# Patient Record
Sex: Female | Born: 1975 | Race: Black or African American | Hispanic: No | Marital: Single | Smoking: Never smoker
Health system: Southern US, Community
[De-identification: ages and names within clinical notes are randomized; demographics above are authoritative.]

## PROBLEM LIST (undated history)

## (undated) DIAGNOSIS — R51 Headache: Secondary | ICD-10-CM

## (undated) DIAGNOSIS — D259 Leiomyoma of uterus, unspecified: Secondary | ICD-10-CM

## (undated) DIAGNOSIS — R87629 Unspecified abnormal cytological findings in specimens from vagina: Secondary | ICD-10-CM

## (undated) DIAGNOSIS — F32A Depression, unspecified: Secondary | ICD-10-CM

## (undated) DIAGNOSIS — J40 Bronchitis, not specified as acute or chronic: Secondary | ICD-10-CM

## (undated) DIAGNOSIS — N939 Abnormal uterine and vaginal bleeding, unspecified: Secondary | ICD-10-CM

## (undated) DIAGNOSIS — N809 Endometriosis, unspecified: Secondary | ICD-10-CM

## (undated) DIAGNOSIS — E669 Obesity, unspecified: Secondary | ICD-10-CM

## (undated) DIAGNOSIS — F329 Major depressive disorder, single episode, unspecified: Secondary | ICD-10-CM

## (undated) HISTORY — DX: Abnormal uterine and vaginal bleeding, unspecified: N93.9

## (undated) HISTORY — DX: Endometriosis, unspecified: N80.9

## (undated) HISTORY — PX: FOOT SURGERY: SHX648

## (undated) HISTORY — PX: DILATION AND CURETTAGE OF UTERUS: SHX78

## (undated) SURGERY — DILATATION AND CURETTAGE /HYSTEROSCOPY
Anesthesia: Choice | Laterality: Bilateral

---

## 2010-07-20 ENCOUNTER — Emergency Department (HOSPITAL_COMMUNITY)
Admission: EM | Admit: 2010-07-20 | Discharge: 2010-07-20 | Disposition: A | Discharge status: Home / Self Care | Payer: Medicaid - Out of State | Attending: Emergency Medicine | Admitting: Emergency Medicine

## 2010-07-20 ENCOUNTER — Emergency Department (HOSPITAL_COMMUNITY): Payer: Medicaid - Out of State

## 2010-07-20 DIAGNOSIS — R071 Chest pain on breathing: Secondary | ICD-10-CM | POA: Insufficient documentation

## 2010-07-20 LAB — POCT I-STAT, CHEM 8
BUN: 13 mg/dL (ref 6–23)
Calcium, Ion: 1.14 mmol/L (ref 1.12–1.32)
Chloride: 105 mEq/L (ref 96–112)
Creatinine, Ser: 0.8 mg/dL (ref 0.4–1.2)
Glucose, Bld: 125 mg/dL — ABNORMAL HIGH (ref 70–99)
HCT: 40 % (ref 36.0–46.0)
Hemoglobin: 13.6 g/dL (ref 12.0–15.0)
Potassium: 4.1 mEq/L (ref 3.5–5.1)
Sodium: 140 mEq/L (ref 135–145)
TCO2: 23 mmol/L (ref 0–100)

## 2010-07-20 LAB — POCT CARDIAC MARKERS
CKMB, poc: 5.5 ng/mL (ref 1.0–8.0)
Myoglobin, poc: 355 ng/mL (ref 12–200)
Troponin i, poc: <0.05 ng/mL (ref 0.00–0.09)

## 2010-07-20 LAB — D-DIMER, QUANTITATIVE: D-Dimer, Quant: 0.41 ug/mL-FEU (ref 0.00–0.48)

## 2011-02-12 ENCOUNTER — Emergency Department (INDEPENDENT_AMBULATORY_CARE_PROVIDER_SITE_OTHER)
Admission: EM | Admit: 2011-02-12 | Discharge: 2011-02-12 | Disposition: A | Discharge status: Home / Self Care | Payer: Self-pay | Source: Home / Self Care | Attending: Emergency Medicine | Admitting: Emergency Medicine

## 2011-02-12 DIAGNOSIS — R6889 Other general symptoms and signs: Secondary | ICD-10-CM

## 2011-02-12 HISTORY — DX: Bronchitis, not specified as acute or chronic: J40

## 2011-02-12 MED ORDER — ACETAMINOPHEN-CODEINE #3 300-30 MG PO TABS: 1.0000 | ORAL_TABLET | Freq: Four times a day (QID) | ORAL | Status: AC | PRN

## 2011-02-12 NOTE — ED Notes (Signed)
Pt states symptoms started Friday evening, c/o chills, bodyaches, cough, fever, weakness and headache

## 2011-02-12 NOTE — ED Provider Notes (Signed)
History     CSN: [id number] Arrival date & time: 02/12/2011 10:06 AM   First MD Initiated Contact with Patient 02/12/11 0945      Chief Complaint  Patient presents with  . Cough    non productive cough, bodyaches, chills, ha, fever, weakness    (Consider location/radiation/quality/duration/timing/severity/associated sxs/prior treatment) HPI Comments: Since Friday body aches and  sweating, runny and congested nose and coughing"  Patient is a 35 y.o. female presenting with cough. The history is provided by the patient.  Cough This is a new problem. The current episode started 2 days ago. The problem occurs constantly. The cough is non-productive. Associated symptoms include chills, sweats, headaches, rhinorrhea, sore throat and myalgias. Pertinent negatives include no shortness of breath and no wheezing. She has tried decongestants for the symptoms. She is not a smoker. Her past medical history does not include bronchitis.    Past Medical History  Diagnosis Date  . Asthma   . Bronchitis     Past Surgical History  Procedure Date  . Cesarean section   . Dilation and curettage of uterus     History reviewed. No pertinent family history.  History  Substance Use Topics  . Smoking status: Never Smoker   . Smokeless tobacco: Never Used  . Alcohol Use: 1.2 oz/week    1 Glasses of wine, 1 Cans of beer per week     occasional    OB History    Grav Para Term Preterm Abortions TAB SAB Ect Mult Living                  Review of Systems  Constitutional: Positive for chills.  HENT: Positive for sore throat and rhinorrhea.   Respiratory: Positive for cough. Negative for shortness of breath and wheezing.   Musculoskeletal: Positive for myalgias.  Neurological: Positive for headaches.    Allergies  Review of patient's allergies indicates no known allergies.  Home Medications   Current Outpatient Rx  Name Route Sig Dispense Refill  . AMOXICILLIN 250 MG PO CAPS Oral Take  250 mg by mouth 3 (three) times daily.      . IBUPROFEN 200 MG PO TABS Oral Take 200 mg by mouth every 6 (six) hours as needed.      . ACETAMINOPHEN-CODEINE #3 300-30 MG PO TABS Oral Take 1-2 tablets by mouth every 6 (six) hours as needed for pain. 15 tablet 0    BP 176/83  Pulse 103  Temp(Src) 99.2 F (37.3 C) (Oral)  Resp 19  SpO2 95%  Physical Exam  Nursing note and vitals reviewed. Constitutional: She appears well-developed and well-nourished. No distress.  HENT:  Nose: Rhinorrhea present.  Mouth/Throat: Uvula is midline, oropharynx is clear and moist and mucous membranes are normal. No oropharyngeal exudate.  Eyes: Conjunctivae are normal.  Neck: Normal range of motion. Neck supple. No JVD present. No Kernig's sign noted. No thyromegaly present.  Cardiovascular: Regular rhythm.   Pulmonary/Chest: Effort normal and breath sounds normal. She has no wheezes. She has no rhonchi. She has no rales.  Lymphadenopathy:    She has no cervical adenopathy.    ED Course  Procedures (including critical care time)  Labs Reviewed - No data to display No results found.   1. Influenza-like symptoms       MDM  ILI x 48 hours- Normal lung exam        Jimmie Molly, MD 02/12/11 1108

## 2011-04-01 ENCOUNTER — Emergency Department (HOSPITAL_COMMUNITY): Payer: Self-pay

## 2011-04-01 ENCOUNTER — Encounter (HOSPITAL_COMMUNITY): Payer: Self-pay | Admitting: Emergency Medicine

## 2011-04-01 ENCOUNTER — Emergency Department (HOSPITAL_COMMUNITY)
Admission: EM | Admit: 2011-04-01 | Discharge: 2011-04-01 | Disposition: A | Discharge status: Home / Self Care | Payer: Self-pay | Attending: Emergency Medicine | Admitting: Emergency Medicine

## 2011-04-01 DIAGNOSIS — M79673 Pain in unspecified foot: Secondary | ICD-10-CM

## 2011-04-01 DIAGNOSIS — J45909 Unspecified asthma, uncomplicated: Secondary | ICD-10-CM | POA: Insufficient documentation

## 2011-04-01 DIAGNOSIS — M79609 Pain in unspecified limb: Secondary | ICD-10-CM | POA: Insufficient documentation

## 2011-04-01 DIAGNOSIS — M722 Plantar fascial fibromatosis: Secondary | ICD-10-CM | POA: Insufficient documentation

## 2011-04-01 MED ORDER — IBUPROFEN 600 MG PO TABS: 600.0000 mg | ORAL_TABLET | Freq: Four times a day (QID) | ORAL | Status: AC | PRN

## 2011-04-01 MED ORDER — HYDROCODONE-ACETAMINOPHEN 5-500 MG PO TABS: 1.0000 | ORAL_TABLET | Freq: Four times a day (QID) | ORAL | Status: AC | PRN

## 2011-04-01 NOTE — ED Notes (Signed)
Pt. Stated, My feet started hurting 10 days ago and I had an xray in 2006 and said I might have cancer in my feet.

## 2011-04-01 NOTE — ED Provider Notes (Signed)
History     CSN: [id number]  Arrival date & time 04/01/11  1315   First MD Initiated Contact with Patient 04/01/11 1405      Chief Complaint  Patient presents with  . Foot Pain    (Consider location/radiation/quality/duration/timing/severity/associated sxs/prior treatment) Patient is a 36 y.o. female presenting with lower extremity pain. The history is provided by the patient.  Foot Pain Pertinent negatives include no chest pain, no abdominal pain, no headaches and no shortness of breath.  pt c/o bil foot pain for past couple weeks esp left. C/o diffuse pain but especially on medial, plantar aspect. ?hx plantar fasciitis. No recent rx for same. Denies injury or fall. No leg or foot swelling. No ankle pr calf pain or swelling. No hx gout/arthritis. No skin changes, rash or erythema. States had xray left foot several yrs ago as has had chronic intermittent foot pain for years, was told possibly cancer. No redness, no swelling, no fever or chills. Pain is constant, dull, non radiating, worse w walking/standing or palpation.   Past Medical History  Diagnosis Date  . Asthma   . Bronchitis     Past Surgical History  Procedure Date  . Cesarean section   . Dilation and curettage of uterus     No family history on file.  History  Substance Use Topics  . Smoking status: Never Smoker   . Smokeless tobacco: Never Used  . Alcohol Use: 1.2 oz/week    1 Glasses of wine, 1 Cans of beer per week     occasional    OB History    Grav Para Term Preterm Abortions TAB SAB Ect Mult Living                  Review of Systems  Constitutional: Negative for fever and chills.  HENT: Negative for neck pain.   Respiratory: Negative for shortness of breath.   Cardiovascular: Negative for chest pain and leg swelling.  Gastrointestinal: Negative for abdominal pain.  Genitourinary: Negative for flank pain.  Musculoskeletal: Negative for back pain.  Skin: Negative for rash.  Neurological:  Negative for headaches.  Hematological: Does not bruise/bleed easily.  Psychiatric/Behavioral: Negative for confusion.    Allergies  Review of patient's allergies indicates no known allergies.  Home Medications   Current Outpatient Rx  Name Route Sig Dispense Refill  . IBUPROFEN 200 MG PO TABS Oral Take 200 mg by mouth every 6 (six) hours as needed.      Marland Kitchen NAPROXEN SODIUM 220 MG PO TABS Oral Take 220 mg by mouth 2 (two) times daily with a meal.    . TRIAMTERENE-HCTZ 75-50 MG PO TABS Oral Take 1 tablet by mouth daily.      BP 120/81  Pulse 83  Temp 97.6 F (36.4 C)  SpO2 98%  Physical Exam  Nursing note and vitals reviewed. Constitutional: She is oriented to person, place, and time. She appears well-developed and well-nourished. No distress.  Eyes: Conjunctivae are normal. No scleral icterus.  Neck: Neck supple. No tracheal deviation present.  Cardiovascular: Normal rate.   Pulmonary/Chest: Effort normal. No respiratory distress.  Abdominal: Normal appearance. She exhibits no distension.  Musculoskeletal: She exhibits no edema.       Mild tenderness left foot plantar aspect ant to heel, medially. No skin changes or erythema. Dp/pt 2+ palp. Normal cap refill distally. Foot nvi.  No lower leg or calf swelling.   Neurological: She is alert and oriented to person, place, and time.  Steady gait.   Skin: Skin is warm and dry. No rash noted.  Psychiatric: She has a normal mood and affect.    ED Course  Procedures (including critical care time)  Dg Foot Complete Left  04/01/2011  *RADIOLOGY REPORT*  Clinical Data: Foot pain.  No trauma  LEFT FOOT - COMPLETE 3+ VIEW  Comparison: None.  Findings: No fracture  or dislocation of midfoot or forefoot.  The phalanges are normal.  The calcaneus is normal.  No soft tissue abnormality.  There is mild spurring at the Achilles insertion.  IMPRESSION: No acute osseous abnormality.  Original Report Authenticated By: Genevive Bi, M.D.        MDM  Xrays.   Motrin po. rec podiatry and pcp follow up.       Suzi Roots, MD 04/01/11 1500

## 2011-07-06 ENCOUNTER — Encounter (HOSPITAL_COMMUNITY): Payer: Self-pay | Admitting: Emergency Medicine

## 2011-07-06 ENCOUNTER — Emergency Department (HOSPITAL_COMMUNITY)
Admission: EM | Admit: 2011-07-06 | Discharge: 2011-07-06 | Disposition: A | Discharge status: Home / Self Care | Payer: Self-pay | Attending: Emergency Medicine | Admitting: Emergency Medicine

## 2011-07-06 ENCOUNTER — Emergency Department (HOSPITAL_COMMUNITY): Payer: Self-pay

## 2011-07-06 DIAGNOSIS — Y990 Civilian activity done for income or pay: Secondary | ICD-10-CM | POA: Insufficient documentation

## 2011-07-06 DIAGNOSIS — H5710 Ocular pain, unspecified eye: Secondary | ICD-10-CM | POA: Insufficient documentation

## 2011-07-06 DIAGNOSIS — H5712 Ocular pain, left eye: Secondary | ICD-10-CM

## 2011-07-06 DIAGNOSIS — S99929A Unspecified injury of unspecified foot, initial encounter: Secondary | ICD-10-CM | POA: Insufficient documentation

## 2011-07-06 DIAGNOSIS — M25569 Pain in unspecified knee: Secondary | ICD-10-CM | POA: Insufficient documentation

## 2011-07-06 DIAGNOSIS — J45909 Unspecified asthma, uncomplicated: Secondary | ICD-10-CM | POA: Insufficient documentation

## 2011-07-06 DIAGNOSIS — Y9289 Other specified places as the place of occurrence of the external cause: Secondary | ICD-10-CM | POA: Insufficient documentation

## 2011-07-06 DIAGNOSIS — IMO0002 Reserved for concepts with insufficient information to code with codable children: Secondary | ICD-10-CM | POA: Insufficient documentation

## 2011-07-06 DIAGNOSIS — S8990XA Unspecified injury of unspecified lower leg, initial encounter: Secondary | ICD-10-CM | POA: Insufficient documentation

## 2011-07-06 MED ORDER — ERYTHROMYCIN 5 MG/GM OP OINT: TOPICAL_OINTMENT | OPHTHALMIC | Status: AC

## 2011-07-06 NOTE — Discharge Instructions (Signed)
RESOURCE GUIDE  Dental Problems  Patients with Medicaid: [facility] [phone] [address]                                           [phone] [address] Phone:  [phone]                                                   Phone:  [phone]  If unable to pay or uninsured, contact:  [facility] or [facility]. to become qualified for the adult dental clinic.  Chronic Pain Problems Contact [facility]  [phone] Patients need to be referred by their primary care doctor.  Insufficient Money for Medicine Contact United Way:  call "211" or [facility] [phone].  No Primary Care Doctor Call [facility]  [phone] Other agencies that provide inexpensive medical care    [facility]  [phone]    [facility]  [phone]    [facility]  [phone]    [facility]  [phone]    [facility]  [phone]    [facility]  [phone]  Psychological Services [facility] Behavioral [facility]  [phone] [facility]  [phone] [facility]   [phone] (emergency services [phone])  Abuse/Neglect [facility] Child Abuse Hotline 905-581-6670 [facility] Child Abuse Hotline 801-132-3373 (After Hours)  Emergency Shelter [facility] 3033667488  M[facility] Homes [facility] [phone])658-1283 [facility] [phone]  MRSA Hotline #:   (801)716-9820    [facility] Resources  [facility]  United Way                           [facility] [address] [place]                     [address]         [address]  Blondell Reveal Phone:  [phone]                                  Phone:  [phone]                   Phone:   [phone]  [facility] Phone:  [phone]  [facility] Child Abuse Hotline (240) 240-0749 [phone] (After Hours)

## 2011-07-06 NOTE — ED Notes (Signed)
Pt c/o possible "pink eye" in both eyes x several days; pt sts son had recent hx of same; pt sts hit right knee about a week ago and still having pain

## 2011-07-06 NOTE — ED Notes (Signed)
Please note: Patient does not wear contacts or glasses.

## 2011-07-08 NOTE — ED Provider Notes (Signed)
History     CSN: [id number]  Arrival date & time 07/06/11  1010   First MD Initiated Contact with Patient 07/06/11 1020      Chief Complaint  Patient presents with  . Eye Pain  . Knee Pain    (Consider location/radiation/quality/duration/timing/severity/associated sxs/prior treatment) HPI  35yoF presents with multiple complaints. Patient complaining of bilateral eye redness left greater than right. She is not complaining of pain. There is minimal blurred vision in the left eye. She states that the eye redness is improved that was sent to the emergency department by her employer who made that she be treated for conjunctivitis for return to work. He denies headache, dizziness. There've been no fevers, chills. There is no foreign body sensation. Patient also complaining of right knee pain since injury 3 weeks ago. She states she bumped her knee at work 3 weeks ago and has been complaining of pain diffusely since that time. She denies numbness, tingling, weakness of her extremities. She has been ambulatory with alert. She has not taken anything for pain prior to arrival. She rates pain as a 3/10 at this time. Denies vaginal discharge. Denies h/o HTN, denies cp/sob/headache/dizziness/difficulty urinating.  Past Medical History  Diagnosis Date  . Asthma   . Bronchitis     Past Surgical History  Procedure Date  . Cesarean section   . Dilation and curettage of uterus     History reviewed. No pertinent family history.  History  Substance Use Topics  . Smoking status: Never Smoker   . Smokeless tobacco: Never Used  . Alcohol Use: 1.2 oz/week    1 Glasses of wine, 1 Cans of beer per week     occasional    OB History    Grav Para Term Preterm Abortions TAB SAB Ect Mult Living                  Review of Systems  All other systems reviewed and are negative.   except as noted HPI   Allergies  Review of patient's allergies indicates no known allergies.  Home Medications    Current Outpatient Rx  Name Route Sig Dispense Refill  . DIPHENHYDRAMINE-APAP (SLEEP) 25-500 MG PO TABS Oral Take 2 tablets by mouth at bedtime as needed. For sleep and pain    . ERYTHROMYCIN 5 MG/GM OP OINT  Place a 1/2 inch ribbon of ointment into the lower eyelid. 1 g 0    BP 115/77  Pulse 93  Temp(Src) 98.1 F (36.7 C) (Oral)  Resp 18  SpO2 99%  LMP 06/29/2011  Physical Exam  Nursing note and vitals reviewed. Constitutional: She is oriented to person, place, and time. She appears well-developed.  HENT:  Head: Atraumatic.  Mouth/Throat: Oropharynx is clear and moist.  Eyes: Conjunctivae and EOM are normal. Pupils are equal, round, and reactive to light. Right eye exhibits no discharge. Left eye exhibits no discharge. No scleral icterus.       Conjunctiva wnl No discharge EOMI without pain     Neck: Normal range of motion. Neck supple.  Cardiovascular: Normal rate, regular rhythm, normal heart sounds and intact distal pulses.   Pulmonary/Chest: Effort normal and breath sounds normal. No respiratory distress. She has no wheezes. She has no rales.  Abdominal: Soft. She exhibits no distension. There is no tenderness. There is no rebound and no guarding.  Musculoskeletal: Normal range of motion. She exhibits tenderness. She exhibits no edema.       Rt knee with min  diffuse ttp over patella. Full ROM with min pain. Ambulatory without antalgic gait.   Neurological: She is alert and oriented to person, place, and time.  Skin: Skin is warm and dry. No rash noted.  Psychiatric: She has a normal mood and affect.    ED Course  Procedures (including critical care time)  Labs Reviewed - No data to display Dg Knee Complete 4 Views Right  07/06/2011  *RADIOLOGY REPORT*  Clinical Data: Trauma and pain.  RIGHT KNEE - COMPLETE 4+ VIEW  Comparison: None  Findings: Mild irregularity about the medial tibia is favored to represent a physeal spur. No acute fracture or dislocation.  No joint  effusion.  Enthesopathic change at the quadriceps insertion and patellar tendon origin.  IMPRESSION: No acute osseous abnormality.  Original Report Authenticated By: Consuello Bossier, M.D.     1. Eye pain, left   2. Knee injury     MDM  I have very low susp that the patient actually has a conjunctivitis. Visual acuities equal b/l.  She is insistent, however, that she cannot return to work without prescription for antibiotic, therefore erythromycin ointment has been given, along with work note. XR knee after recent injury unremarkable. Neurovasc intact. Ibuprofen, home. Blood pressure improved after recheck. F/U PMD for reassessment  BP 115/77  Pulse 93  Temp(Src) 98.1 F (36.7 C) (Oral)  Resp 18  SpO2 99%  LMP 06/29/2011         Forbes Cellar, MD 07/08/11 [phone]

## 2011-07-17 ENCOUNTER — Encounter (HOSPITAL_COMMUNITY): Payer: Self-pay | Admitting: Emergency Medicine

## 2011-07-17 ENCOUNTER — Emergency Department (HOSPITAL_COMMUNITY)
Admission: EM | Admit: 2011-07-17 | Discharge: 2011-07-17 | Disposition: A | Discharge status: Home / Self Care | Payer: Self-pay | Attending: Emergency Medicine | Admitting: Emergency Medicine

## 2011-07-17 DIAGNOSIS — E119 Type 2 diabetes mellitus without complications: Secondary | ICD-10-CM | POA: Insufficient documentation

## 2011-07-17 DIAGNOSIS — M549 Dorsalgia, unspecified: Secondary | ICD-10-CM | POA: Insufficient documentation

## 2011-07-17 DIAGNOSIS — R350 Frequency of micturition: Secondary | ICD-10-CM | POA: Insufficient documentation

## 2011-07-17 DIAGNOSIS — J45909 Unspecified asthma, uncomplicated: Secondary | ICD-10-CM | POA: Insufficient documentation

## 2011-07-17 LAB — URINALYSIS, ROUTINE W REFLEX MICROSCOPIC
Bilirubin Urine: NEGATIVE
Glucose, UA: NEGATIVE mg/dL
Ketones, ur: NEGATIVE mg/dL
Leukocytes, UA: NEGATIVE
Nitrite: NEGATIVE
Protein, ur: NEGATIVE mg/dL
Specific Gravity, Urine: 1.02 (ref 1.005–1.030)
Urobilinogen, UA: 0.2 mg/dL (ref 0.0–1.0)
pH: 5 (ref 5.0–8.0)

## 2011-07-17 LAB — URINE MICROSCOPIC-ADD ON

## 2011-07-17 MED ORDER — CYCLOBENZAPRINE HCL 10 MG PO TABS: 10.0000 mg | ORAL_TABLET | Freq: Two times a day (BID) | ORAL | Status: AC | PRN

## 2011-07-17 MED ORDER — IBUPROFEN 800 MG PO TABS
800.0000 mg | ORAL_TABLET | Freq: Once | ORAL | Status: AC
  Administered 2011-07-17: 800 mg via ORAL
  Filled 2011-07-17: qty 1

## 2011-07-17 MED ORDER — NAPROXEN 500 MG PO TABS: 500.0000 mg | ORAL_TABLET | Freq: Two times a day (BID) | ORAL | Status: DC

## 2011-07-17 NOTE — Discharge Instructions (Signed)
Back Pain, Adult Low back pain is very common. About 1 in 5 people have back pain.The cause of low back pain is rarely dangerous. The pain often gets better over time.About half of people with a sudden onset of back pain feel better in just 2 weeks. About 8 in 10 people feel better by 6 weeks.  CAUSES Some common causes of back pain include:  Strain of the muscles or ligaments supporting the spine.   Wear and tear (degeneration) of the spinal discs.   Arthritis.   Direct injury to the back.  DIAGNOSIS Most of the time, the direct cause of low back pain is not known.However, back pain can be treated effectively even when the exact cause of the pain is unknown.Answering your caregiver's questions about your overall health and symptoms is one of the most accurate ways to make sure the cause of your pain is not dangerous. If your caregiver needs more information, he or she may order lab work or imaging tests (X-rays or MRIs).However, even if imaging tests show changes in your back, this usually does not require surgery. HOME CARE INSTRUCTIONS For many people, back pain returns.Since low back pain is rarely dangerous, it is often a condition that people can learn to manageon their own.   Remain active. It is stressful on the back to sit or stand in one place. Do not sit, drive, or stand in one place for more than 30 minutes at a time. Take short walks on level surfaces as soon as pain allows.Try to increase the length of time you walk each day.   Do not stay in bed.Resting more than 1 or 2 days can delay your recovery.   Do not avoid exercise or work.Your body is made to move.It is not dangerous to be active, even though your back may hurt.Your back will likely heal faster if you return to being active before your pain is gone.   Pay attention to your body when you bend and lift. Many people have less discomfortwhen lifting if they bend their knees, keep the load close to their  bodies,and avoid twisting. Often, the most comfortable positions are those that put less stress on your recovering back.   Find a comfortable position to sleep. Use a firm mattress and lie on your side with your knees slightly bent. If you lie on your back, put a pillow under your knees.   Only take over-the-counter or prescription medicines as directed by your caregiver. Over-the-counter medicines to reduce pain and inflammation are often the most helpful.Your caregiver may prescribe muscle relaxant drugs.These medicines help dull your pain so you can more quickly return to your normal activities and healthy exercise.   Put ice on the injured area.   Put ice in a plastic bag.   Place a towel between your skin and the bag.   Leave the ice on for 15 to 20 minutes, 3 to 4 times a day for the first 2 to 3 days. After that, ice and heat may be alternated to reduce pain and spasms.   Ask your caregiver about trying back exercises and gentle massage. This may be of some benefit.   Avoid feeling anxious or stressed.Stress increases muscle tension and can worsen back pain.It is important to recognize when you are anxious or stressed and learn ways to manage it.Exercise is a great option.  SEEK MEDICAL CARE IF:  You have pain that is not relieved with rest or medicine.   You have   pain that does not improve in 1 week.   You have new symptoms.   You are generally not feeling well.  SEEK IMMEDIATE MEDICAL CARE IF:   You have pain that radiates from your back into your legs.   You develop new bowel or bladder control problems.   You have unusual weakness or numbness in your arms or legs.   You develop nausea or vomiting.   You develop abdominal pain.   You feel faint.  Document Released: 02/20/2005 Document Revised: 02/09/2011 Document Reviewed: 07/11/2010 ExitCare Patient Information 2012 [employer].     RESOURCE GUIDE  Dental Problems  Patients with  Medicaid: [facility]                     [facility] [address]                                           [address] Phone:  [phone]                                                  Phone:  [phone]  If unable to pay or uninsured, contact:  [facility] or [facility] to become qualified for the adult dental clinic.  Chronic Pain Problems Contact [facility]  [phone] Patients need to be referred by their primary care doctor.  Insufficient Money for Medicine Contact United Way:  call "211" or [facility] [phone].  No Primary Care Doctor Call [facility]  832-8000 Other agencies that provide inexpensive medical care    [facility]  [phone]    [facility]  [phone]    [facility]  [phone]    Women's Clinic  [phone]    [facility]  [phone]    [facility]  [phone]  Psychological Services [facility]  [phone] [facility]  [phone] [facility]   [phone] (emergency services [phone])  Substance Abuse Resources [facility]  336-882-2125 [facility] 336-784-9470 [facility] 336-285-9073 [facility] 336-845-3988 Residential & Outpatient Substance Abuse Program  800-659-3381  Abuse/Neglect [place] Child Abuse Hotline (336) 641-3795 [place] Child Abuse Hotline 800-378-5315 (After Hours)  Emergency Shelter [facility] (336) 271-5985  [facility] (336) 275-9566 [facility] [phone]) [phone]  MRSA Hotline #:   832-7006    [place] Resources  [facility] [place]     United Way                          [facility] [address] [place]                       [address]      [address]  [place]                                                   [place]                            [place] Phone:  [phone]                                   Phone:  [phone]                 Phone:  [phone]  [facility] [facility] Phone:  [phone]  [place] Child Abuse Hotline (336) 342-1394 (336) 342-3537 (After Hours)   

## 2011-07-17 NOTE — ED Notes (Signed)
Pt reports pain to left lower back beginning Saturday. Intermittent in nature. Pt reports frequency and blood in urination. Also reports pain to right ankle and swelling.

## 2011-07-17 NOTE — ED Provider Notes (Signed)
History  This chart was scribed for Tracy Razor, MD by Bennett Scrape. This patient was seen in room PED8/PED08 and the patient's care was started at 12:43PM.  CSN: [id number]  Arrival date & time 07/17/11  1228   First MD Initiated Contact with Patient 07/17/11 1235      Chief Complaint  Patient presents with  . Back Pain  . Ankle Pain    The history is provided by the patient. No language interpreter was used.    Tracy Rosales is a 36 y.o. female who presents to the Emergency Department complaining of 3 weeks of gradual onset, intermittent left-sided back pain described as a sharp tingling sensation with associated increased frequency. She states that the most recent episode started when she woke up this morning. She is here to be evaluated because it is worse than normal. She denies injury as the cause of the pain. She reports that the pain is worse going from a laying position to upright. Pt also reports right ankle swelling and tingling since 2011. She also states that her toenails have turned gray. Pt has a h/o diabetes and is afraid that her diabetes is the reason for the swelling and tingling. She has not taken any medications to improve her symptoms. She has not seen her PCP for the symptoms. She denies any other symptoms such as hematuria, dysuria, nausea and emesis. She also has a h/o asthma and bronchitis. She is an occasional alcohol user but denies smoking.   Past Medical History  Diagnosis Date  . Asthma   . Bronchitis   . Diabetes mellitus     Past Surgical History  Procedure Date  . Cesarean section   . Dilation and curettage of uterus     No family history on file.  History  Substance Use Topics  . Smoking status: Never Smoker   . Smokeless tobacco: Never Used  . Alcohol Use: 1.2 oz/week    1 Glasses of wine, 1 Cans of beer per week     occasional     Review of Systems  Constitutional: Negative for fever and chills.  Respiratory: Negative for cough  and shortness of breath.   Gastrointestinal: Negative for nausea and vomiting.  Genitourinary: Positive for frequency. Negative for hematuria.  Musculoskeletal: Positive for back pain.  Neurological: Negative for weakness and headaches.  All other systems reviewed and are negative.    Allergies  Review of patient's allergies indicates no known allergies.  Home Medications   Current Outpatient Rx  Name Route Sig Dispense Refill  . DIPHENHYDRAMINE-APAP (SLEEP) 25-500 MG PO TABS Oral Take 2 tablets by mouth at bedtime as needed. For sleep and pain    . ERYTHROMYCIN 5 MG/GM OP OINT  Place a 1/2 inch ribbon of ointment into the lower eyelid. 1 g 0    Triage Vitals: BP 127/86  Pulse 83  Temp(Src) 97.5 F (36.4 C) (Oral)  Resp 20  SpO2 99%  LMP 06/29/2011  Physical Exam  Nursing note and vitals reviewed. Constitutional: She is oriented to person, place, and time. She appears well-developed and well-nourished. No distress.  HENT:  Head: Normocephalic and atraumatic.  Eyes: EOM are normal.  Neck: Neck supple. No tracheal deviation present.  Cardiovascular: Normal rate.   Pulmonary/Chest: Effort normal. No respiratory distress.  Musculoskeletal: Normal range of motion.       No midline spinal or paraspinal tenderness, right ankle is grossly normal to the left ankle, palpable DP pulses bilaterally, ROM in the  right ankle without pain  Neurological: She is alert and oriented to person, place, and time.  Skin: Skin is warm and dry.       No skin lesions to the back or right ankle noted  Psychiatric: She has a normal mood and affect. Her behavior is normal.    ED Course  Procedures (including critical care time)  DIAGNOSTIC STUDIES: Oxygen Saturation is 99% on room air, normal by my interpretation.    COORDINATION OF CARE: 12:47PM-Discussed urinalysis as treatment plan with pt and pt agreed to plan.  Labs Reviewed  URINALYSIS, ROUTINE W REFLEX MICROSCOPIC - Abnormal; Notable  for the following:    APPearance HAZY (*)    Hgb urine dipstick LARGE (*)    All other components within normal limits  URINE MICROSCOPIC-ADD ON - Abnormal; Notable for the following:    Squamous Epithelial / LPF MANY (*)    Bacteria, UA FEW (*)    All other components within normal limits  LAB REPORT - SCANNED   No results found.   1. Back pain       MDM  35yf with diffuse back pain. Atraumatic. Nonfocal neuro exam. Suspect muscle strain. Ankel exam unremarkable. UA with blood but otherwise no urinary symptoms and doubt UTI as etiology of symptoms. Plan symptomatic tx and outpt fu. Return precautions discussed.    I personally preformed the services scribed in my presence. The recorded information has been reviewed and considered. Tracy Razor, MD.    Tracy Razor, MD 07/20/11 [phone]

## 2011-08-26 ENCOUNTER — Emergency Department (HOSPITAL_COMMUNITY): Payer: Self-pay

## 2011-08-26 ENCOUNTER — Encounter (HOSPITAL_COMMUNITY): Payer: Self-pay | Admitting: Emergency Medicine

## 2011-08-26 ENCOUNTER — Emergency Department (HOSPITAL_COMMUNITY)
Admission: EM | Admit: 2011-08-26 | Discharge: 2011-08-27 | Disposition: A | Discharge status: Home / Self Care | Payer: Self-pay | Attending: Emergency Medicine | Admitting: Emergency Medicine

## 2011-08-26 DIAGNOSIS — D251 Intramural leiomyoma of uterus: Secondary | ICD-10-CM | POA: Insufficient documentation

## 2011-08-26 DIAGNOSIS — E119 Type 2 diabetes mellitus without complications: Secondary | ICD-10-CM | POA: Insufficient documentation

## 2011-08-26 DIAGNOSIS — J45909 Unspecified asthma, uncomplicated: Secondary | ICD-10-CM | POA: Insufficient documentation

## 2011-08-26 DIAGNOSIS — D219 Benign neoplasm of connective and other soft tissue, unspecified: Secondary | ICD-10-CM

## 2011-08-26 LAB — URINALYSIS, ROUTINE W REFLEX MICROSCOPIC
Glucose, UA: NEGATIVE mg/dL
Ketones, ur: 15 mg/dL — AB
Nitrite: NEGATIVE
Protein, ur: 100 mg/dL — AB
Specific Gravity, Urine: 1.04 — ABNORMAL HIGH (ref 1.005–1.030)
Urobilinogen, UA: 1 mg/dL (ref 0.0–1.0)
pH: 5.5 (ref 5.0–8.0)

## 2011-08-26 LAB — BASIC METABOLIC PANEL
BUN: 15 mg/dL (ref 6–23)
CO2: 21 mEq/L (ref 19–32)
Calcium: 9.2 mg/dL (ref 8.4–10.5)
Chloride: 105 mEq/L (ref 96–112)
Creatinine, Ser: 0.75 mg/dL (ref 0.50–1.10)
GFR calc Af Amer: >90 mL/min (ref >90)
GFR calc non Af Amer: >90 mL/min (ref >90)
Glucose, Bld: 151 mg/dL — ABNORMAL HIGH (ref 70–99)
Potassium: 3.4 mEq/L — ABNORMAL LOW (ref 3.5–5.1)
Sodium: 139 mEq/L (ref 135–145)

## 2011-08-26 LAB — DIFFERENTIAL
Basophils Absolute: 0 10*3/uL (ref 0.0–0.1)
Basophils Relative: 0 % (ref 0–1)
Eosinophils Absolute: 0.1 10*3/uL (ref 0.0–0.7)
Eosinophils Relative: 1 % (ref 0–5)
Lymphocytes Relative: 22 % (ref 12–46)
Lymphs Abs: 1.8 10*3/uL (ref 0.7–4.0)
Monocytes Absolute: 0.5 10*3/uL (ref 0.1–1.0)
Monocytes Relative: 6 % (ref 3–12)
Neutro Abs: 5.7 10*3/uL (ref 1.7–7.7)
Neutrophils Relative %: 71 % (ref 43–77)

## 2011-08-26 LAB — URINE MICROSCOPIC-ADD ON

## 2011-08-26 LAB — CBC
HCT: 33.8 % — ABNORMAL LOW (ref 36.0–46.0)
Hemoglobin: 11.2 g/dL — ABNORMAL LOW (ref 12.0–15.0)
MCH: 28.6 pg (ref 26.0–34.0)
MCHC: 33.1 g/dL (ref 30.0–36.0)
MCV: 86.2 fL (ref 78.0–100.0)
Platelets: 292 10*3/uL (ref 150–400)
RBC: 3.92 MIL/uL (ref 3.87–5.11)
RDW: 13.9 % (ref 11.5–15.5)
WBC: 8.1 10*3/uL (ref 4.0–10.5)

## 2011-08-26 LAB — POCT PREGNANCY, URINE: Preg Test, Ur: NEGATIVE

## 2011-08-26 NOTE — Discharge Instructions (Signed)
Your ultrasound showed that you have a uterine fibroid. This is an overgrowth of the smooth tissue which can cause women to have heavy menstrual bleeding. Follow up with GYN for further evaluation and treatment. Return to [facility] should you have persistent heavy bleeding, feel weak, or develop new or worsening symptoms.  RESOURCE GUIDE  Chronic Pain Problems: Contact [facility]  [phone] Patients need to be referred by their primary care doctor.  Insufficient Money for Medicine: Contact United Way:  call "211" or [facility] [phone].  No Primary Care Doctor: - Call [facility]  [phone] - can help you locate a primary care doctor that  accepts your insurance, provides certain services, etc. - Physician Referral Service- [phone]  Agencies that provide inexpensive medical care: - [facility]  [phone] - [facility]  [phone] - [facility]  [phone] - Women's Clinic  [phone] - Planned Parenthood  [phone] [facility]  [phone]  Medicaid-accepting [facility] Providers: - [facility]- [address]  [phone], Mon-Fri 9am-7pm, Sat 9am-1pm - [facility]- [address] [place], [address] [place]  [phone] - [facility]- [address] [place]  [phone] [facility]- [address]  [phone] - Renaye Rakers- [address] [place], [address], [phone]  Only accepts [place] patients after they have their name  applied to their card  Self Pay (no insurance) in [place]: - Sickle Cell Patients: Dr Willey Blade, [facility]  [address] [place], [phone] - [facility]- [address] [place]  [phone]       [facility] [place]- [address]       -     [facility]- see information above (Speak  to [employer] if you do not have insurance)       -  [facility]- [address] [place], [phone]       -  [facility]- [address],  [phone]       -  [facility]- [address] [phone]       -  Dr Julio Sicks-  [address], [place], [phone]       -  [facility]- [address], [phone]       -  [facility]- [address], [phone], also [address] [phone]       -    [facility]: Lambert Rhonda W- [address] [place], [phone], 1st & 3rd Saturday   every month, 10am-1pm  1) Find a Doctor and Pay Out of Pocket Although you won't have to find out who is covered by your insurance plan, it is a good idea to ask around and get recommendations. You will then need to call the office and see if the doctor you have chosen will accept you as a new patient and what types of options they offer for patients who are self-pay. Some doctors offer discounts or will set up payment plans for their patients who do not have insurance, but you will need to ask so you aren't surprised when you get to your appointment.  2) Contact Your Local Health Department Not all health departments have doctors that can see patients  for sick visits, but many do, so it is worth a call to see if yours does. If you don't know where your local health department is, you can check in your phone book. The CDC also has a tool to help you locate your state's health department, and many state websites also have listings of all of their local health departments.  3) Find a Walk-in Clinic If your illness is not likely to be very severe or complicated, you may want to try a walk in clinic. These are popping up all over the country in pharmacies, drugstores, and shopping centers. They're usually staffed by nurse practitioners or physician assistants that have been trained to treat common illnesses and complaints. They're usually fairly quick and inexpensive.  However, if you have serious medical issues or chronic medical problems, these are probably not your best option  STD Testing - [facility] Department of [facility], STD Clinic, [address] [place], phone [phone] or [phone].  Monday - Friday, call for an appointment. [facility] Department of [employer], STD Clinic, [address], [place], phone [phone] or [phone].  Monday - Friday, call for an appointment.  Abuse/Neglect: [facility] Child Abuse Hotline 276-702-1012 [facility] Child Abuse Hotline 323-556-2640 (After Hours)  Emergency Shelter:  [facility] [phone] 649-0371  Maternity Homes: - [facility] 863-545-4754 - [facility] (251)[phone]  MRSA Hotline #:   7734395708  [facility]  [facility] [place]  United Way [facility] Dept. [address]                 [address]         [address] [place] [address]  Blondell Reveal Phone:  [phone]                                  Phone:  [phone]                   Phone:  [phone]  [facility] Mental Health, [phone] - [phone]       -     [facility] in [place], [address]                                  [phone], [facility] Child Abuse Hotline (334)378-3868 or (332) 423-2424 (After Hours)   Behavioral Health Services  Substance Abuse Resources: - Alcohol and Drug Services  262-823-9446 - [facility] [phone] - [facility] [place] (208)[phone] Floydene Flock [phone] - Residential & Outpatient Substance Abuse Program  [phone]  Psychological Services: [facility]  [phone] Services   [phone] - [facility], [phone] [place]. [address], [place], ACCESS  LINE: [phone] or 3212200379, [employer]  Dental Assistance  If unable to pay or uninsured, contact:  [facility] or [facility]. to become qualified for the adult dental clinic.  Patients with Medicaid: [facility] [phone] [address], [phone] [address] [phone]  If unable to pay, or uninsured, contact [facility] [phone])[phone]) or [facility] Department [phone] in [place], [phone] in [facility]) to become qualified for the adult dental clinic  Other Low-Cost Community Dental Services: - [facility]- [address] [place], [place], [place], [address], [phone], [phone], 2nd and 4th Thursday of the month at 6:30am.  10 clients each day by appointment, can sometimes see walk-in patients if someone does not show for an appointment. [facility]- [address] [place], [place], [address], [phone] - [facility]- [address], [place], [place], [address], [phone] [facility] Health Department- [phone] [facility] Health Department- [phone] [facility] [phone]      Fibroids You have been diagnosed as having a fibroid. Fibroids are smooth muscle lumps (tumors) which can occur any place in a woman's body. They are usually in the womb (uterus). The most common problem (symptom) of fibroids is bleeding. Over time this may cause low red blood cells (anemia). Other symptoms include feelings of pressure and pain in the pelvis. The diagnosis (learning what is wrong) of fibroids is made by physical exam. Sometimes tests such as an ultrasound are used. This is helpful when fibroids are felt around the ovaries and to look for tumors. TREATMENT   Most fibroids do not need surgical or medical treatment. Sometimes a tissue sample  (biopsy) of the lining of the uterus is done to rule out cancer. If there is no cancer and only a small amount of bleeding, the problem can be watched.   Hormonal treatment can improve the problem.   When surgery is needed, it can consist of removing the fibroid. Vaginal birth may not be possible after the removal of fibroids. This depends on where they are and the extent of surgery. When pregnancy occurs with fibroids it is usually normal.   Your caregiver can help decide which treatments are best for you.  HOME CARE INSTRUCTIONS   Do not use aspirin as this may increase bleeding problems.   If your periods (menses) are heavy, record the number of pads or tampons used per month. Bring this information to your caregiver. This can help them determine the best treatment for you.  SEEK IMMEDIATE MEDICAL CARE IF:  You have pelvic pain or cramps not controlled with medications, or experience a sudden increase in pain.   You have an increase of pelvic bleeding between and during menses.   You feel lightheaded or have fainting spells.   You develop worsening belly (abdominal) pain.  Document Released: 02/18/2000 Document Revised: 02/09/2011 Document Reviewed: 10/10/2007 [facility] Patient Information 2012 [place], [place].

## 2011-08-26 NOTE — ED Notes (Signed)
Patient transported to Ultrasound 

## 2011-08-26 NOTE — ED Provider Notes (Signed)
History     CSN: [id number]  Arrival date & time 08/26/11  6440   First MD Initiated Contact with Patient 08/26/11 2126      Chief Complaint  Patient presents with  . Abdominal Pain    (Consider location/radiation/quality/duration/timing/severity/associated sxs/prior treatment) HPI History from patient. 36 year old female who presents with complaint of vaginal bleeding. She states that she had a menstrual period which was normal for her at the very end of May then began to bleed again 2 days ago. She reports that her bleeding has been very heavy with clots. She has been going through about one pad per hour. She has had accompanying suprapubic and right lower abdominal pain with this. She reports that several years ago she had to undergo a dilation and curettage of the uterus which she was told was secondary to "polyps." Her symptoms were similar to this at that time. She denies any nausea, vomiting, changes in bowel movements, urinary symptoms. Denies fever, chills, or changes in appetite. She does not have a local OB/GYN.  Past Medical History  Diagnosis Date  . Asthma   . Bronchitis   . Diabetes mellitus     Past Surgical History  Procedure Date  . Cesarean section   . Dilation and curettage of uterus     No family history on file.  History  Substance Use Topics  . Smoking status: Never Smoker   . Smokeless tobacco: Never Used  . Alcohol Use: 1.2 oz/week    1 Glasses of wine, 1 Cans of beer per week     occasional    OB History    Grav Para Term Preterm Abortions TAB SAB Ect Mult Living                  Review of Systems as per history of present illness. 10 point review of systems otherwise negative.  Allergies  Review of patient's allergies indicates no known allergies.  Home Medications  No current outpatient prescriptions on file.  BP 121/77  Pulse 91  Temp 98.1 F (36.7 C) (Oral)  Resp 16  SpO2 99%  LMP 08/23/2011  Physical Exam  Nursing note  and vitals reviewed. Constitutional: She appears well-developed and well-nourished. No distress.  HENT:  Head: Normocephalic and atraumatic.  Eyes:       Normal appearance  Neck: Normal range of motion.  Cardiovascular: Normal rate, regular rhythm and normal heart sounds.   Pulmonary/Chest: Effort normal and breath sounds normal.  Abdominal: Soft. Bowel sounds are normal. There is tenderness.         Tender to palpation to suprapubic and lower right lower quadrant. No rebound or guarding.  Genitourinary:       RN chaperone present during exam. Large amount of dark blood present in the vaginal vault. No cervical motion tenderness. No masses appreciated on bimanual exam, but the patient has tenderness to palpation over the  uterus.  Musculoskeletal: Normal range of motion.  Neurological: She is alert.  Skin: Skin is warm and dry. She is not diaphoretic.  Psychiatric: She has a normal mood and affect.    ED Course  Procedures (including critical care time)  Labs Reviewed  CBC - Abnormal; Notable for the following:    Hemoglobin 11.2 (*)     HCT 33.8 (*)     All other components within normal limits  BASIC METABOLIC PANEL - Abnormal; Notable for the following:    Potassium 3.4 (*)     Glucose, Bld  151 (*)     All other components within normal limits  URINALYSIS, ROUTINE W REFLEX MICROSCOPIC - Abnormal; Notable for the following:    Color, Urine RED (*)  BIOCHEMICALS MAY BE AFFECTED BY COLOR   APPearance TURBID (*)     Specific Gravity, Urine 1.040 (*)     Hgb urine dipstick LARGE (*)     Bilirubin Urine MODERATE (*)     Ketones, ur 15 (*)     Protein, ur 100 (*)     Leukocytes, UA SMALL (*)     All other components within normal limits  DIFFERENTIAL  POCT PREGNANCY, URINE  URINE MICROSCOPIC-ADD ON   US Transvaginal Non-ob  08/26/2011  *RADIOLOGY REPORT*  Clinical Data: Bleeding, pain.  TRANSABDOMINAL AND TRANSVAGINAL ULTRASOUND OF PELVIS  Technique:  Both transabdominal  and transvaginal ultrasound examinations of the pelvis were performed including evaluation of the uterus, ovaries, adnexal regions, and pelvic cul-de-sac.  Comparison: None.  Findings:  Uterus: 11.3 x 6.1 x 6.5 cm.  1.9 cm right-sided intramural fibroid.  Mildly heterogeneous echotexture throughout the uterus. This can be seen with adenomyosis.  Endometrium: Endometrium upper limits normal in thickness at 15 mm, homogeneous.  Right Ovary: 2.8 x 2.1 x 3.1 cm.  The right ovary can only be visualized transabdominally.  Visualization is limited.  No definite ovarian or adnexal mass.  Left Ovary: Left ovary not visualized.  Other Findings:  No free fluid.  IMPRESSION: 1.9 cm right intramural fibroid.  Heterogeneous echotexture within the uterus which could reflect adenomyosis.  Ovaries difficult to visualize.  No adnexal masses seen.  Original Report Authenticated By: Cyndie Chime, M.D.   US Pelvis Complete  08/26/2011  *RADIOLOGY REPORT*  Clinical Data: Bleeding, pain.  TRANSABDOMINAL AND TRANSVAGINAL ULTRASOUND OF PELVIS  Technique:  Both transabdominal and transvaginal ultrasound examinations of the pelvis were performed including evaluation of the uterus, ovaries, adnexal regions, and pelvic cul-de-sac.  Comparison: None.  Findings:  Uterus: 11.3 x 6.1 x 6.5 cm.  1.9 cm right-sided intramural fibroid.  Mildly heterogeneous echotexture throughout the uterus. This can be seen with adenomyosis.  Endometrium: Endometrium upper limits normal in thickness at 15 mm, homogeneous.  Right Ovary: 2.8 x 2.1 x 3.1 cm.  The right ovary can only be visualized transabdominally.  Visualization is limited.  No definite ovarian or adnexal mass.  Left Ovary: Left ovary not visualized.  Other Findings:  No free fluid.  IMPRESSION: 1.9 cm right intramural fibroid.  Heterogeneous echotexture within the uterus which could reflect adenomyosis.  Ovaries difficult to visualize.  No adnexal masses seen.  Original Report Authenticated By:  Cyndie Chime, M.D.     1. Fibroid       MDM  Patient presents with heavy vaginal bleeding for the past 2 days. Has had similar in the past and was told that she had some type of polyps. On exam, she is noted to have dark red blood with clots in the vaginal vault. Her urine pregnancy test is negative. Her hemoglobin is stable. Ultrasound shows a intramural fibroid which is likely the explanation of the patient's symptoms. Patient was instructed to followup with OB/GYN. Instructed to return to [facility] should symptoms return or worsen.       Grant Fontana, PA-C 08/27/11 0134

## 2011-08-26 NOTE — ED Notes (Signed)
Pt reports passing blood clots from vagina since May.  States she goes through three sanitary pads in an hour.  Pt also c/o RLQ abdominal pain that started about two days ago.

## 2011-08-26 NOTE — ED Notes (Signed)
PT. REPORTS 3RD DAY OF MENSTRUAL CYCLE , STATES BLOOD CLOTS WITH CRAMPS AT RIGHT HIP/RIGHT ABDOMEN.

## 2011-08-27 NOTE — ED Provider Notes (Signed)
Medical screening examination/treatment/procedure(s) were performed by non-physician practitioner and as supervising physician I was immediately available for consultation/collaboration.  Jametta Moorehead M Vinson Tietze, MD 08/27/11 1254 

## 2011-10-13 ENCOUNTER — Ambulatory Visit (INDEPENDENT_AMBULATORY_CARE_PROVIDER_SITE_OTHER): Payer: Medicaid Other | Admitting: Obstetrics & Gynecology

## 2011-10-13 ENCOUNTER — Encounter: Payer: Self-pay | Admitting: Obstetrics & Gynecology

## 2011-10-13 VITALS — BP 108/76 | HR 76 | Temp 97.5°F | Ht 66.0 in | Wt 242.0 lb

## 2011-10-13 DIAGNOSIS — D259 Leiomyoma of uterus, unspecified: Secondary | ICD-10-CM

## 2011-10-13 DIAGNOSIS — N949 Unspecified condition associated with female genital organs and menstrual cycle: Secondary | ICD-10-CM

## 2011-10-13 DIAGNOSIS — R102 Pelvic and perineal pain: Secondary | ICD-10-CM

## 2011-10-13 MED ORDER — DICLOFENAC POTASSIUM 50 MG PO TABS: 50.0000 mg | ORAL_TABLET | Freq: Three times a day (TID) | ORAL | Status: DC | PRN

## 2011-10-13 NOTE — Progress Notes (Signed)
Subjective:     Patient ID: Tracy Rosales, female   DOB: December 14, 1975, 36 y.o.   MRN: [id number]  HPI Pt is a G2P002 who was seen in the ED in June for pelvic pain.  Pt was told that it is related to a uterine fibroid.  Pt wants to conceive and has been 'trying hard for a month to get pregnant'.  Pt with monthly cycles that are painful.  Does NOT have pain or bleeding between cycles.    Review of Systems No bowel or bladder issues    Objective:   Physical Exam Pt in NAD.  VSS afebrile Lungs: CTA CV:  RRR Abd: NT/ND; GU: EGBUS: no lesions Vagina: no blood in vault Cervix: no lesion; no mucopurulent d/c Uterus: small, mobile Adnexa: no masses; nontender        Assessment:     Pelvic pain/dysmenorrhea    Plan:     Cataflam 50mg  1 po tid prn F/u 3 months for Annual with pap D/w pt optimal timing of intercourse for conception  Darrie Macmillan L. Harraway-Smith, M.D., [place]

## 2011-10-13 NOTE — Patient Instructions (Signed)
Pelvic Pain Pelvic pain is pain below the belly button and located between your hips. Acute pain may last a few hours or days. Chronic pelvic pain may last weeks and months. The cause may be different for different types of pain. The pain may be dull or sharp, mild or severe and can interfere with your daily activities. Write down and tell your caregiver:   Exactly where the pain is located.   If it comes and goes or is there all the time.   When it happens (with sex, urination, bowel movement, etc.)   If the pain is related to your menstrual period or stress.  Your caregiver will take a full history and do a complete physical exam and Pap test. CAUSES   Painful menstrual periods (dysmenorrhea).   Normal ovulation (Mittelschmertz) that occurs in the middle of the menstrual cycle every month.   The pelvic organs get engorged with blood just before the menstrual period (pelvic congestive syndrome).   Scar tissue from an infection or past surgery (pelvic adhesions).   Cancer of the female pelvic organs. When there is pain with cancer, it has been there for a long time.   The lining of the uterus (endometrium) abnormally grows in places like the pelvis and on the pelvic organs (endometriosis).   A form of endometriosis with the lining of the uterus present inside of the muscle tissue of the uterus (adenomyosis).   Fibroid tumor (noncancerous) in the uterus.   Bladder problems such as infection, bladder spasms of the muscle tissue of the bladder.   Intestinal problems (irritable bowel syndrome, colitis, an ulcer or gastrointestinal infection).   Polyps of the cervix or uterus.   Pregnancy in the tube (ectopic pregnancy).   The opening of the cervix is too small for the menstrual blood to flow through it (cervical stenosis).   Physical or sexual abuse (past or present).   Musculo-skeletal problems from poor posture, problems with the vertebrae of the lower back or the uterine  pelvic muscles falling (prolapse).   Psychological problems such as depression or stress.   IUD (intrauterine device) in the uterus.  DIAGNOSIS  Tests to make a diagnosis depends on the type, location, severity and what causes the pain to occur. Tests that may be needed include:  Blood tests.   Urine tests   Ultrasound.   X-rays.   CT Scan.   MRI.   Laparoscopy.   Major surgery.  TREATMENT  Treatment will depend on the cause of the pain, which includes:  Prescription or over-the-counter pain medication.   Antibiotics.   Birth control pills.   Hormone treatment.   Nerve blocking injections.   Physical therapy.   Antidepressants.   Counseling with a psychiatrist or psychologist.   Minor or major surgery.  HOME CARE INSTRUCTIONS   Only take over-the-counter or prescription medicines for pain, discomfort or fever as directed by your caregiver.   Follow your caregiver's advice to treat your pain.   Rest.   Avoid sexual intercourse if it causes the pain.   Apply warm or cold compresses (which ever works best) to the pain area.   Do relaxation exercises such as yoga or meditation.   Try acupuncture.   Avoid stressful situations.   Try group therapy.   If the pain is because of a stomach/intestinal upset, drink clear liquids, eat a bland light food diet until the symptoms go away.  SEEK MEDICAL CARE IF:   You need stronger prescription pain medication.     You develop pain with sexual intercourse.   You have pain with urination.   You develop a temperature of 102 F (38.9 C) with the pain.   You are still in pain after 4 hours of taking prescription medication for the pain.   You need depression medication.   Your IUD is causing pain and you want it removed.  SEEK IMMEDIATE MEDICAL CARE IF:  You develop very severe pain or tenderness.   You faint, have chills, severe weakness or dehydration.   You develop heavy vaginal bleeding or passing  solid tissue.   You develop a temperature of 102 F (38.9 C) with the pain.   You have blood in the urine.   You are being physically or sexually abused.   You have uncontrolled vomiting and diarrhea.   You are depressed and afraid of harming yourself or someone else.  Document Released: 03/30/2004 Document Revised: 02/09/2011 Document Reviewed: 12/26/2007 [facility] Patient Information 2012 [place], [place].Endometriosis Endometriosis is a disease that occurs when the endometrium (lining of the uterus) is misplaced outside of its normal location. It may occur in many locations close to the uterus (womb), but commonly on the ovaries, fallopian tubes, vagina (birth canal) and bowel located close to the uterus. Because the uterus sloughs (expels) its lining every month (menses), there is bleeding whereever the endometrial tissue is located. SYMPTOMS  Often there are no symptoms. However, because blood is irritating to tissues not normally exposed to it, when symptoms occur they vary with the location of the misplaced endometrium. Symptoms often include back and abdominal pain. Periods may be heavier and intercourse may be painful. Infertility may be present. You may have all of these symptoms at one time or another or you may have months with no symptoms at all. Although the symptoms occur mainly during menses, they can occur mid-cycle as well, and usually terminate with menopause. DIAGNOSIS  Your caregiver may recommend a blood test and urine test (urinalysis) to help rule out other conditions. Another common test is ultrasound, a painless procedure that uses sound waves to make a sonogram "picture" of abnormal tissue that could be endometriosis. If your bowel movements are painful around your periods, your caregiver may advise a barium enema (an X-ray of the lower bowel), to try to find the source of your pain. This is sometimes confirmed by laparoscopy. Laparoscopy is a procedure where your caregiver  looks into your abdomen with a laparoscope (a small pencil sized telescope). Your caregiver may take a tiny piece of tissue (biopsy) from any abnormal tissue to confirm or document your problem. These tissues are sent to the lab and a pathologist looks at them under the microscope to give a microscopic diagnosis. TREATMENT  Once the diagnosis is made, it can be treated by destruction of the misplaced endometrial tissue using heat (diathermy), laser, cutting (excision), or chemical means. It may also be treated with hormonal therapy. When using hormonal therapy menses are eliminated, therefore eliminating the monthly exposure to blood by the misplaced endometrial tissue. Only in severe cases is it necessary to perform a hysterectomy with removal of the tubes, uterus and ovaries. HOME CARE INSTRUCTIONS   Only take over-the-counter or prescription medicines for pain, discomfort, or fever as directed by your caregiver.   Avoid activities that produce pain, including a physical sexual relationship.   Do not take aspirin as this may increase bleeding when not on hormonal therapy.   See your caregiver for pain or problems not controlled with treatment.  SEEK IMMEDIATE MEDICAL CARE IF:   Your pain is severe and is not responding to pain medicine.   You develop severe nausea and vomiting, or you cannot keep foods down.   Your pain localizes to the right lower part of your abdomen (possible appendicitis).   You have swelling or increasing pain in the abdomen.   You have a fever.   You see blood in your stool.  MAKE SURE YOU:   Understand these instructions.   Will watch your condition.   Will get help right away if you are not doing well or get worse.  Document Released: 02/18/2000 Document Revised: 02/09/2011 Document Reviewed: 10/09/2007 [facility] Patient Information 2012 [place], [place].

## 2011-10-18 ENCOUNTER — Telehealth: Payer: Self-pay | Admitting: General Practice

## 2011-10-18 NOTE — Telephone Encounter (Signed)
[facility] pharmacy called stating that the Rx for diclofenac was not covered by Medicaid and wanted to know if we could switch this to another medicine.

## 2011-10-19 MED ORDER — DICLOFENAC SODIUM 75 MG PO TBEC: DELAYED_RELEASE_TABLET | ORAL | Status: DC

## 2011-10-19 NOTE — Telephone Encounter (Signed)
Called pt and informed her that a substitute medication is being prescribed for her since she has no insurance.  I told her that the medication would be less expensive @ [facility] or [facility]. She requested Rx to be sent to [facility] on [address].  Pt was advised that she may pick up her med later today.  Pt voiced understanding.

## 2011-10-19 NOTE — Telephone Encounter (Signed)
Yes. Ok to substitute.  clh-[id number]

## 2011-10-19 NOTE — Addendum Note (Signed)
Addended by: Jill Side on: 10/19/2011 01:13 PM   Modules accepted: Orders

## 2012-01-08 ENCOUNTER — Emergency Department (HOSPITAL_COMMUNITY)
Admission: EM | Admit: 2012-01-08 | Discharge: 2012-01-08 | Disposition: A | Discharge status: Home / Self Care | Payer: Medicaid Other | Attending: Emergency Medicine | Admitting: Emergency Medicine

## 2012-01-08 ENCOUNTER — Encounter (HOSPITAL_COMMUNITY): Payer: Self-pay | Admitting: Emergency Medicine

## 2012-01-08 DIAGNOSIS — M545 Low back pain, unspecified: Secondary | ICD-10-CM | POA: Insufficient documentation

## 2012-01-08 DIAGNOSIS — E119 Type 2 diabetes mellitus without complications: Secondary | ICD-10-CM | POA: Insufficient documentation

## 2012-01-08 DIAGNOSIS — M549 Dorsalgia, unspecified: Secondary | ICD-10-CM

## 2012-01-08 DIAGNOSIS — J45909 Unspecified asthma, uncomplicated: Secondary | ICD-10-CM | POA: Insufficient documentation

## 2012-01-08 LAB — URINALYSIS, ROUTINE W REFLEX MICROSCOPIC
Bilirubin Urine: NEGATIVE
Glucose, UA: NEGATIVE mg/dL
Hgb urine dipstick: NEGATIVE
Ketones, ur: NEGATIVE mg/dL
Nitrite: NEGATIVE
Protein, ur: NEGATIVE mg/dL
Specific Gravity, Urine: 1.027 (ref 1.005–1.030)
Urobilinogen, UA: 1 mg/dL (ref 0.0–1.0)
pH: 5.5 (ref 5.0–8.0)

## 2012-01-08 LAB — URINE MICROSCOPIC-ADD ON

## 2012-01-08 LAB — POCT PREGNANCY, URINE: Preg Test, Ur: NEGATIVE

## 2012-01-08 MED ORDER — HYDROCODONE-ACETAMINOPHEN 5-325 MG PO TABS: 1.0000 | ORAL_TABLET | Freq: Three times a day (TID) | ORAL | Status: DC | PRN

## 2012-01-08 MED ORDER — DIAZEPAM 5 MG PO TABS: 5.0000 mg | ORAL_TABLET | Freq: Two times a day (BID) | ORAL | Status: DC

## 2012-01-08 MED ORDER — IBUPROFEN 200 MG PO TABS: 800.0000 mg | ORAL_TABLET | Freq: Three times a day (TID) | ORAL | Status: DC

## 2012-01-08 NOTE — ED Notes (Signed)
Pt c/o lower back pain into sides x months; pt denies obvious injury

## 2012-01-08 NOTE — ED Provider Notes (Signed)
History   This chart was scribed for Gerhard Munch, MD by Gerlean Ren. This patient was seen in room TR10C/TR10C and the patient's care was started at 19:48.   CSN: [id number]  Arrival date & time 01/08/12  1745   None     Chief Complaint  Patient presents with  . Back Pain    (Consider location/radiation/quality/duration/timing/severity/associated sxs/prior treatment) The history is provided by the patient. No language interpreter was used.   Tracy Rosales is a 36 y.o. female who presents to the Emergency Department complaining of several months of sudden onset, constant lower back pain radiating to bilateral sides with no known injury or trauma as cause.  Pt denies tingling and numbness in all 4 extremities and incontinence.  Pt reports mild frequency and intermittent episodes of weakness but reports normal ambulation.  Pt has not received any previous medical attention for pain.    Past Medical History  Diagnosis Date  . Asthma   . Bronchitis   . Diabetes mellitus     Past Surgical History  Procedure Date  . Cesarean section   . Dilation and curettage of uterus     History reviewed. No pertinent family history.  History  Substance Use Topics  . Smoking status: Never Smoker   . Smokeless tobacco: Never Used  . Alcohol Use: 1.2 oz/week    1 Glasses of wine, 1 Cans of beer per week     Comment: occasional    OB History    Grav Para Term Preterm Abortions TAB SAB Ect Mult Living   2 2 2       2       Review of Systems  Constitutional:       Per HPI, otherwise negative  HENT:       Per HPI, otherwise negative  Eyes: Negative.   Respiratory:       Per HPI, otherwise negative  Cardiovascular:       Per HPI, otherwise negative  Gastrointestinal: Negative for vomiting.  Genitourinary: Negative.   Musculoskeletal: Positive for back pain.       Per HPI, otherwise negative  Skin: Negative.   Neurological: Negative for syncope.    Allergies  Review of patient's  allergies indicates no known allergies.  Home Medications   Current Outpatient Rx  Name  Route  Sig  Dispense  Refill  . DICLOFENAC SODIUM 75 MG PO TBEC      Take 1 tablet 2 times daily as needed with a meal   60 tablet   2     BP 137/85  Pulse 104  Temp 98.4 F (36.9 C) (Oral)  Resp 18  SpO2 99%  Physical Exam  Nursing note and vitals reviewed. Constitutional: She is oriented to person, place, and time. She appears well-developed and well-nourished. No distress.  HENT:  Head: Normocephalic and atraumatic.  Eyes: Conjunctivae normal and EOM are normal.  Cardiovascular: Normal rate and regular rhythm.   Pulmonary/Chest: Effort normal and breath sounds normal. No stridor. No respiratory distress.  Abdominal: She exhibits no distension.  Musculoskeletal: She exhibits no edema.       No tenderness or obvious deformities over back.   Good strength in bilateral lower extremities.    Neurological: She is alert and oriented to person, place, and time. No cranial nerve deficit.  Skin: Skin is warm and dry.  Psychiatric: She has a normal mood and affect.    ED Course  Procedures (including critical care time) DIAGNOSTIC STUDIES: Oxygen  Saturation is 99% on room air, normal by my interpretation.    COORDINATION OF CARE: 19:52- Patient informed of clinical course, understands medical decision-making process, and agrees with plan.    Labs Reviewed  URINALYSIS, ROUTINE W REFLEX MICROSCOPIC - Abnormal; Notable for the following:    Leukocytes, UA SMALL (*)     All other components within normal limits  URINE MICROSCOPIC-ADD ON - Abnormal; Notable for the following:    Squamous Epithelial / LPF MANY (*)     Bacteria, UA FEW (*)     All other components within normal limits  POCT PREGNANCY, URINE  URINE CULTURE   Results for orders placed during the hospital encounter of 01/08/12  URINALYSIS, ROUTINE W REFLEX MICROSCOPIC      Component Value Range   Color, Urine YELLOW   YELLOW   APPearance CLEAR  CLEAR   Specific Gravity, Urine 1.027  1.005 - 1.030   pH 5.5  5.0 - 8.0   Glucose, UA NEGATIVE  NEGATIVE mg/dL   Hgb urine dipstick NEGATIVE  NEGATIVE   Bilirubin Urine NEGATIVE  NEGATIVE   Ketones, ur NEGATIVE  NEGATIVE mg/dL   Protein, ur NEGATIVE  NEGATIVE mg/dL   Urobilinogen, UA 1.0  0.0 - 1.0 mg/dL   Nitrite NEGATIVE  NEGATIVE   Leukocytes, UA SMALL (*) NEGATIVE  POCT PREGNANCY, URINE      Component Value Range   Preg Test, Ur NEGATIVE  NEGATIVE  URINE MICROSCOPIC-ADD ON      Component Value Range   Squamous Epithelial / LPF MANY (*) RARE   WBC, UA 3-6  <3 WBC/hpf   Bacteria, UA FEW (*) RARE    No results found.   No diagnosis found.    MDM  I personally performed the services described in this documentation, which was scribed in my presence. The recorded information has been reviewed and considered.  This female with months of back pain, with a current exacerbation now presents in no distress.  The patient is neurologically intact.  The patient's vital signs are essentially unremarkable.  We discussed the need for ongoing primary care management, appropriate analgesics, and the patient was discharged with resources, follow up instructions, return precautions.  Gerhard Munch, MD 01/08/12 2006

## 2012-01-09 LAB — URINE CULTURE: Colony Count: 40000

## 2012-01-25 ENCOUNTER — Ambulatory Visit: Payer: Medicaid Other | Admitting: Obstetrics & Gynecology

## 2012-03-23 ENCOUNTER — Inpatient Hospital Stay (HOSPITAL_COMMUNITY)
Admission: AD | Admit: 2012-03-23 | Discharge: 2012-03-23 | Disposition: A | Discharge status: Home / Self Care | Payer: Self-pay | Source: Ambulatory Visit | Attending: Obstetrics & Gynecology | Admitting: Obstetrics & Gynecology

## 2012-03-23 ENCOUNTER — Encounter (HOSPITAL_COMMUNITY): Payer: Self-pay | Admitting: Family

## 2012-03-23 DIAGNOSIS — Z3202 Encounter for pregnancy test, result negative: Secondary | ICD-10-CM | POA: Insufficient documentation

## 2012-03-23 DIAGNOSIS — R102 Pelvic and perineal pain: Secondary | ICD-10-CM

## 2012-03-23 DIAGNOSIS — M549 Dorsalgia, unspecified: Secondary | ICD-10-CM | POA: Insufficient documentation

## 2012-03-23 DIAGNOSIS — R109 Unspecified abdominal pain: Secondary | ICD-10-CM | POA: Insufficient documentation

## 2012-03-23 HISTORY — DX: Headache: R51

## 2012-03-23 HISTORY — DX: Leiomyoma of uterus, unspecified: D25.9

## 2012-03-23 HISTORY — DX: Major depressive disorder, single episode, unspecified: F32.9

## 2012-03-23 HISTORY — DX: Depression, unspecified: F32.A

## 2012-03-23 LAB — URINALYSIS, ROUTINE W REFLEX MICROSCOPIC
Bilirubin Urine: NEGATIVE
Glucose, UA: NEGATIVE mg/dL
Hgb urine dipstick: NEGATIVE
Ketones, ur: NEGATIVE mg/dL
Leukocytes, UA: NEGATIVE
Nitrite: NEGATIVE
Protein, ur: NEGATIVE mg/dL
Specific Gravity, Urine: >1.03 — ABNORMAL HIGH (ref 1.005–1.030)
Urobilinogen, UA: 0.2 mg/dL (ref 0.0–1.0)
pH: 5.5 (ref 5.0–8.0)

## 2012-03-23 LAB — CBC
HCT: 33 % — ABNORMAL LOW (ref 36.0–46.0)
Hemoglobin: 10.8 g/dL — ABNORMAL LOW (ref 12.0–15.0)
MCH: 28.1 pg (ref 26.0–34.0)
MCHC: 32.7 g/dL (ref 30.0–36.0)
MCV: 85.7 fL (ref 78.0–100.0)
Platelets: 331 10*3/uL (ref 150–400)
RBC: 3.85 MIL/uL — ABNORMAL LOW (ref 3.87–5.11)
RDW: 14.2 % (ref 11.5–15.5)
WBC: 7.3 10*3/uL (ref 4.0–10.5)

## 2012-03-23 LAB — WET PREP, GENITAL
Clue Cells Wet Prep HPF POC: NONE SEEN
Trich, Wet Prep: NONE SEEN
Yeast Wet Prep HPF POC: NONE SEEN

## 2012-03-23 LAB — POCT PREGNANCY, URINE: Preg Test, Ur: NEGATIVE

## 2012-03-23 NOTE — MAU Note (Signed)
Pt reports she has been having back pain and sharp pain in her RLQ (since July) seen in clinic downstairs told she has fibroids. Pain got worse last night at work

## 2012-03-23 NOTE — MAU Provider Note (Signed)
History     CSN: [id number]  Arrival date & time 03/23/12  1200   None     Chief Complaint  Patient presents with  . Abdominal Pain    (Consider location/radiation/quality/duration/timing/severity/associated sxs/prior treatment) HPI Tracy Rosales is a 37 y.o. [id number]. LMP ~12/10. She presents with c/o bil back pain that radiates bil to low abd, R>L. She denies change in discharge, has odor, no unusual bleeding or spotting.  Has frequency, urgency, no dysuria. No GI changes. Last partner x 2-69m, last contact 9/13, no STD screen yet.   She has had the pain  x 7-8 months, is getting progressively worse. Was dx with 1.9 cm R intramural fibroid and possible adenomyosis 6/13. She had f/u visit in GYN clinic 8/13, Rx Cataflam for pain.   Past Medical History  Diagnosis Date  . Asthma   . Bronchitis   . Uterine fibroid   . Headache   . Depression     Past Surgical History  Procedure Date  . Cesarean section   . Dilation and curettage of uterus     for heavy periods     History reviewed. No pertinent family history.  History  Substance Use Topics  . Smoking status: Never Smoker   . Smokeless tobacco: Never Used  . Alcohol Use: 1.2 oz/week    1 Glasses of wine, 1 Cans of beer per week     Comment: occasional    OB History    Grav Para Term Preterm Abortions TAB SAB Ect Mult Living   2 2 2       2       Review of Systems  Constitutional: Negative for fever and chills.  Gastrointestinal: Positive for abdominal pain. Negative for nausea, vomiting, diarrhea and constipation.  Genitourinary: Positive for urgency and frequency. Negative for dysuria, vaginal bleeding, vaginal discharge, difficulty urinating and vaginal pain.    Allergies  Review of patient's allergies indicates no known allergies.  Home Medications  No current outpatient prescriptions on file.  BP 117/70  Pulse 78  Temp 97.8 F (36.6 C) (Oral)  Resp 18  Ht 5\' 4"  (1.626 m)  Wt 247 lb 6.4 oz (112.22 kg)   BMI 42.47 kg/m2  LMP 02/05/2012  Physical Exam  Constitutional: She is oriented to person, place, and time. She appears well-developed and well-nourished.  Abdominal: Soft. Bowel sounds are normal. She exhibits no distension and no mass. There is tenderness. There is no rebound and no guarding.  Genitourinary:       Ext genitalia- nl anatomy, skin intact Vagina- small amt thick white discharge Cx- parous Bladder-sl tender Uterus-sl tender, unable to assess size due to pt habitus Adn- no masses palp, sl tender   Musculoskeletal: Normal range of motion.  Neurological: She is alert and oriented to person, place, and time.  Skin: Skin is warm and dry.  Psychiatric: She has a normal mood and affect. Her behavior is normal.    ED Course  Procedures (including critical care time)  Labs Reviewed  URINALYSIS, ROUTINE W REFLEX MICROSCOPIC - Abnormal; Notable for the following:    Specific Gravity, Urine >1.030 (*)     All other components within normal limits  POCT PREGNANCY, URINE  WET PREP, GENITAL  GC/CHLAMYDIA PROBE AMP  CBC   Results for orders placed during the hospital encounter of 03/23/12 (from the past 24 hour(s))  URINALYSIS, ROUTINE W REFLEX MICROSCOPIC     Status: Abnormal   Collection Time   03/23/12 12:15 PM  Component Value Range   Color, Urine YELLOW  YELLOW   APPearance CLEAR  CLEAR   Specific Gravity, Urine >1.030 (*) 1.005 - 1.030   pH 5.5  5.0 - 8.0   Glucose, UA NEGATIVE  NEGATIVE mg/dL   Hgb urine dipstick NEGATIVE  NEGATIVE   Bilirubin Urine NEGATIVE  NEGATIVE   Ketones, ur NEGATIVE  NEGATIVE mg/dL   Protein, ur NEGATIVE  NEGATIVE mg/dL   Urobilinogen, UA 0.2  0.0 - 1.0 mg/dL   Nitrite NEGATIVE  NEGATIVE   Leukocytes, UA NEGATIVE  NEGATIVE  POCT PREGNANCY, URINE     Status: Normal   Collection Time   03/23/12 12:27 PM      Component Value Range   Preg Test, Ur NEGATIVE  NEGATIVE  CBC     Status: Abnormal   Collection Time   03/23/12  1:15 PM       Component Value Range   WBC 7.3  4.0 - 10.5 K/uL   RBC 3.85 (*) 3.87 - 5.11 MIL/uL   Hemoglobin 10.8 (*) 12.0 - 15.0 g/dL   HCT 08.6 (*) 57.8 - 46.9 %   MCV 85.7  78.0 - 100.0 fL   MCH 28.1  26.0 - 34.0 pg   MCHC 32.7  30.0 - 36.0 g/dL   RDW 62.9  52.8 - 41.3 %   Platelets 331  150 - 400 K/uL  WET PREP, GENITAL     Status: Abnormal   Collection Time   03/23/12  1:27 PM      Component Value Range   Yeast Wet Prep HPF POC NONE SEEN  NONE SEEN   Trich, Wet Prep NONE SEEN  NONE SEEN   Clue Cells Wet Prep HPF POC NONE SEEN  NONE SEEN   WBC, Wet Prep HPF POC MODERATE (*) NONE SEEN    No results found.  ASSESSMENT Back pain x 6-7 m, non GYN, needs PCP evaluation UPT neg, CBC, urine and wet prep neg GC/CT cultures are pending   No diagnosis found.  PLAN:  Await cultures to treat To new clinic at [facility] for evaluation back pain Make appt in GYN clinic for annual exam with PAP- was due 11/13.   MDM

## 2012-03-23 NOTE — MAU Note (Addendum)
Patient presents to MAU with c/o lower back pain radiating to lower abdomen. Last night at work experienced sharp shooting pain (10/10) in lower abdomen. 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  Reports she was diagnosed with uterine fibroids in July and has not been treated or seen for them since. Patient is currently not sexually active, however desires to have more children. Reports she had new partner since being since in July for fibroids. Also reports urinary frequency.  Denies vaginal bleeding or discharge.

## 2012-03-23 NOTE — MAU Note (Signed)
Patient CBG 92.  Glucometer did not recognize patient's armband; paper faxed to [employer] for manual override.

## 2012-03-24 LAB — GC/CHLAMYDIA PROBE AMP
CT Probe RNA: NEGATIVE
GC Probe RNA: NEGATIVE

## 2012-03-25 NOTE — MAU Provider Note (Signed)
Attestation of Attending Supervision of Advanced Practitioner (CNM/NP): Evaluation and management procedures were performed by the Advanced Practitioner under my supervision and collaboration. I have reviewed the Advanced Practitioner's note and chart, and I agree with the management and plan.  Zoi Devine H. 11:55 AM   

## 2012-03-26 LAB — GLUCOSE, CAPILLARY: Glucose-Capillary: 92 mg/dL (ref 70–99)

## 2014-01-05 ENCOUNTER — Encounter (HOSPITAL_COMMUNITY): Payer: Self-pay | Admitting: Family

## 2014-01-21 IMAGING — CR DG KNEE COMPLETE 4+V*R*
4 series · 4 of 4 positions shown · non-contrast
Comparison: None

CLINICAL DATA: Trauma and pain.

RIGHT KNEE - COMPLETE 4+ VIEW

[t knee ap right]
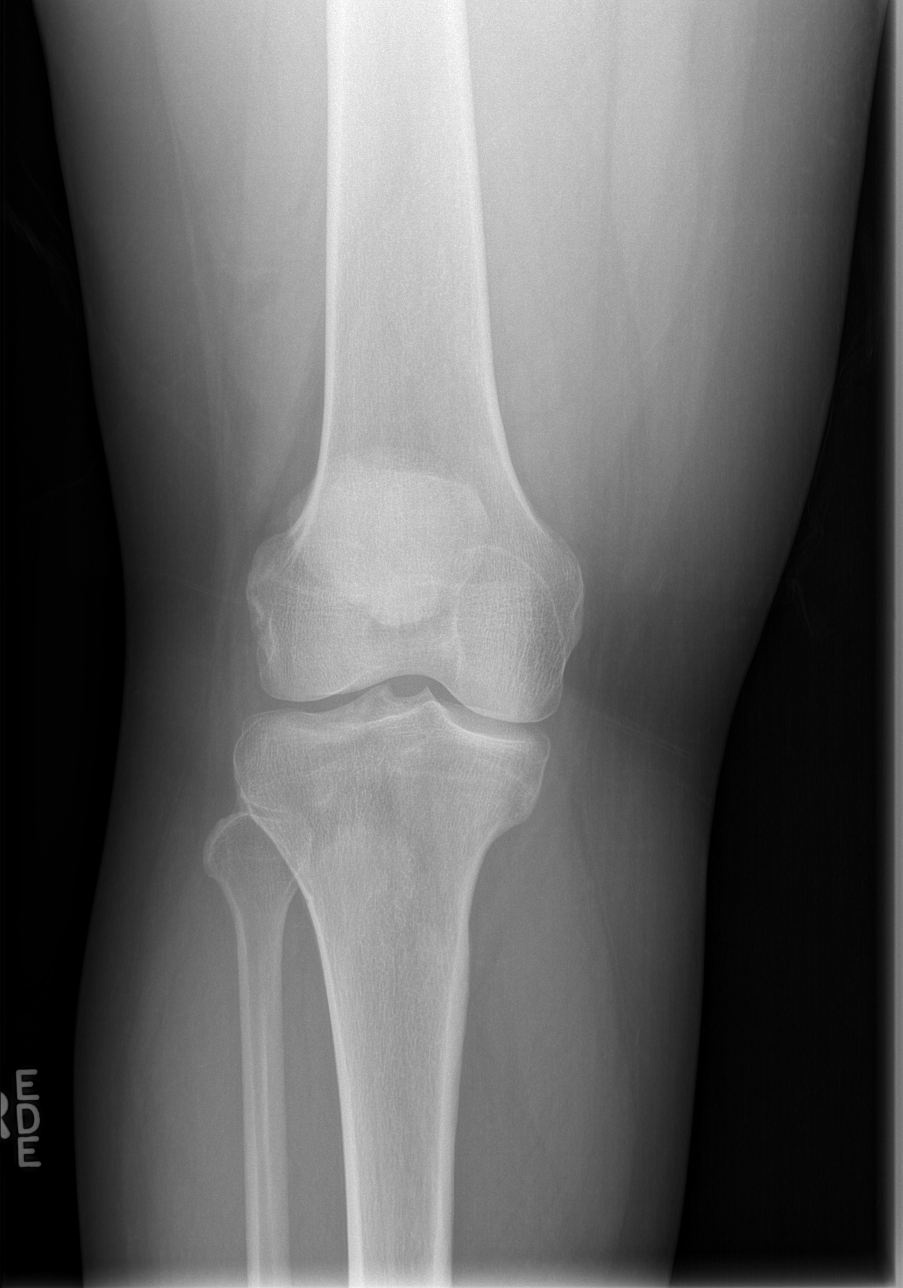

[t knee oblique right (1 of 2)]
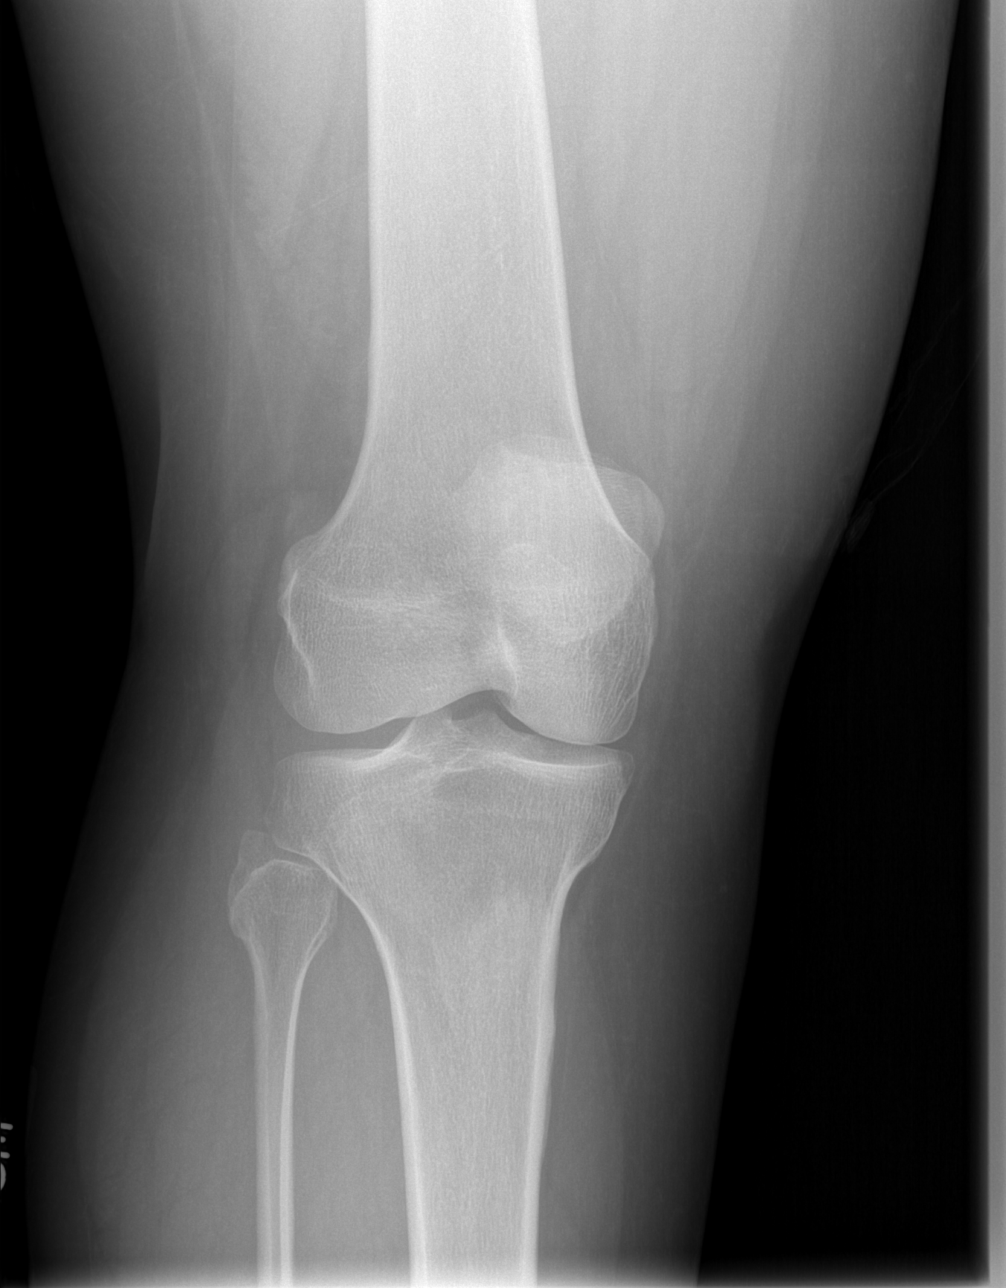

[t knee oblique right (2 of 2)]
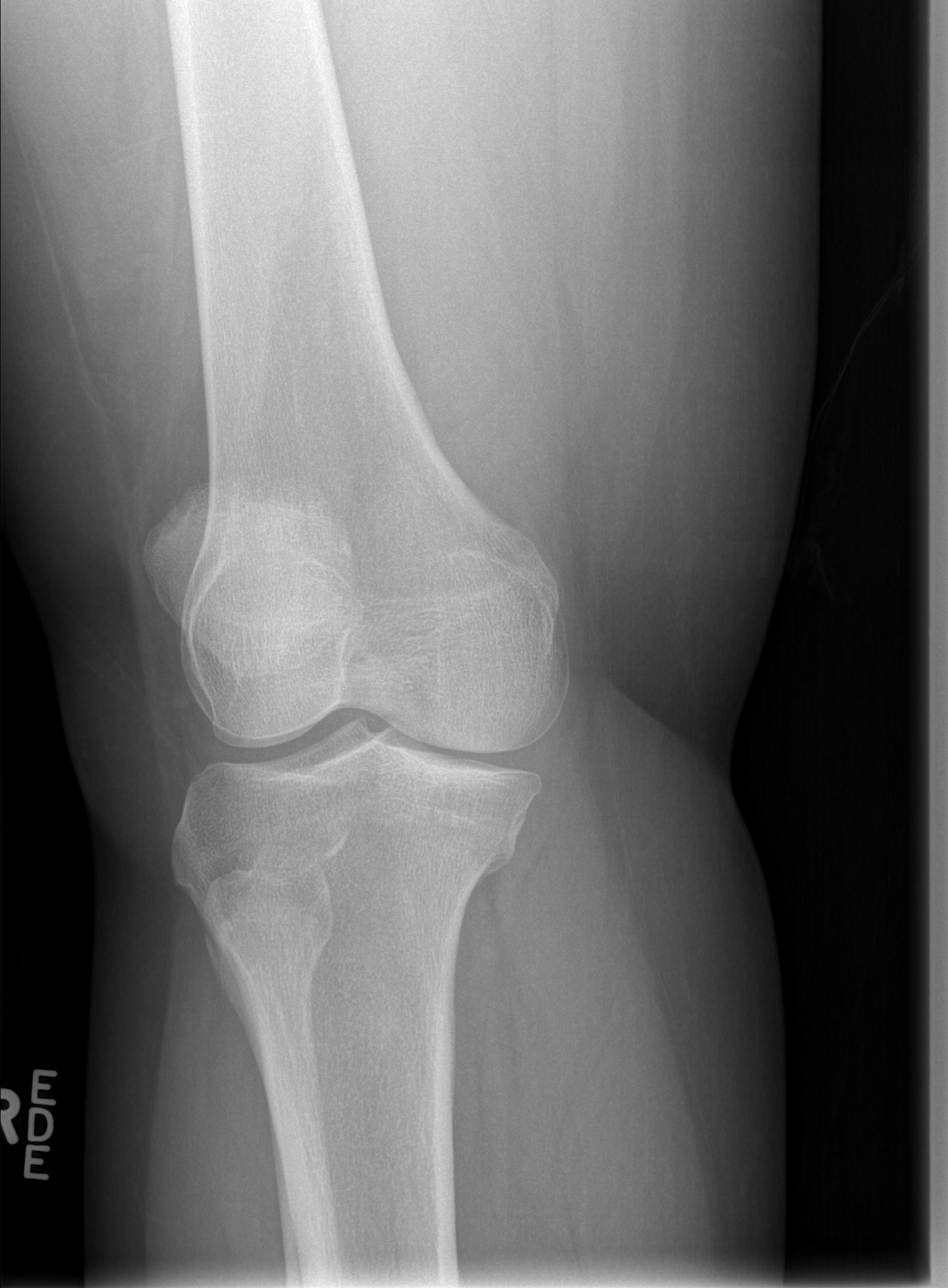

[t knee lat right]
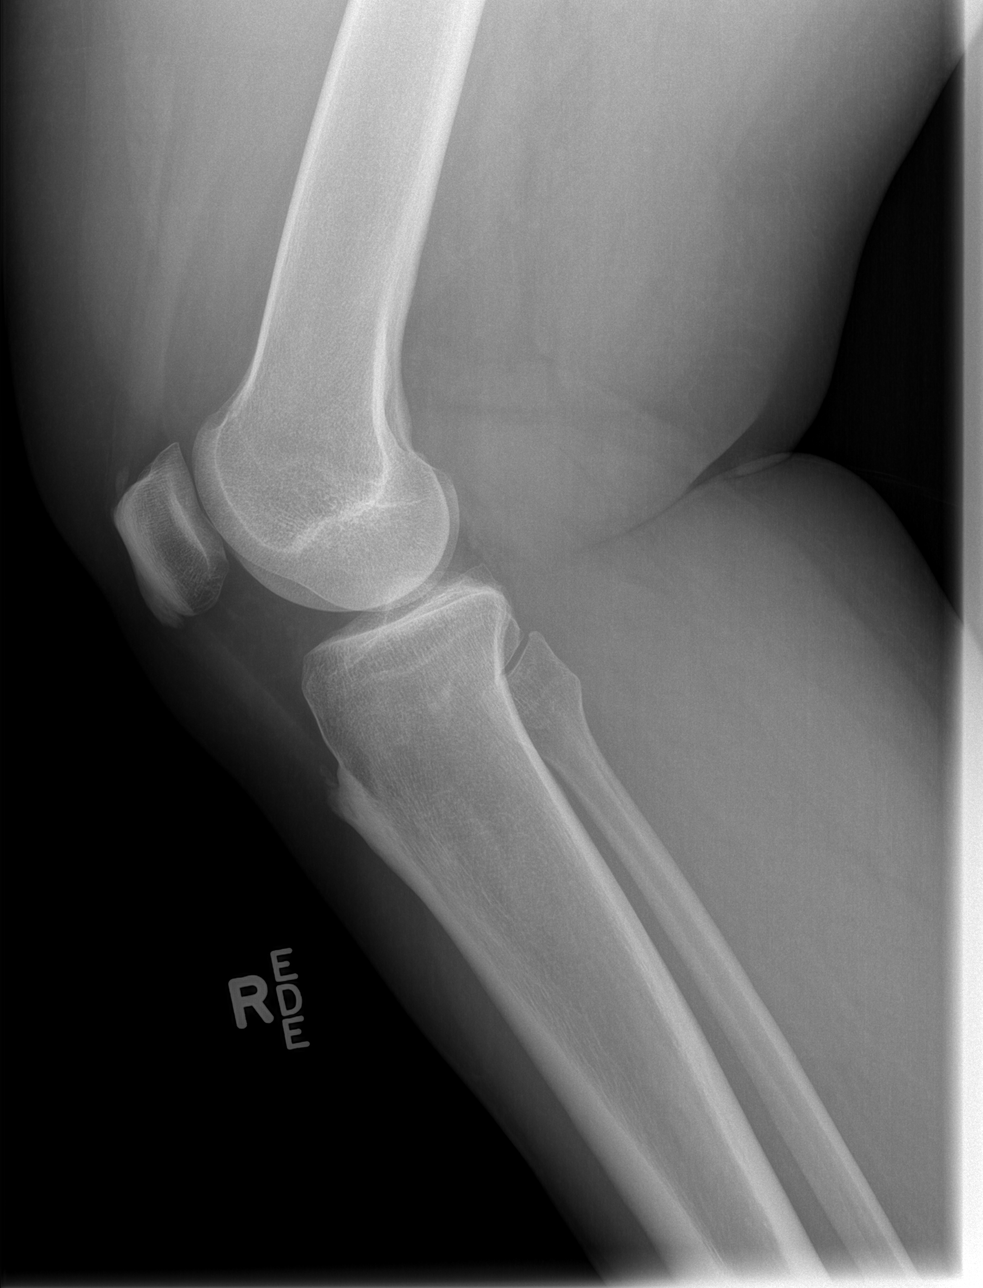

[4 of 4 positions shown; findings below may reference images not displayed]

FINDINGS: Mild irregularity about the medial tibia is favored to
represent a physeal spur. No acute fracture or dislocation.  No
joint effusion.  Enthesopathic change at the quadriceps insertion
and patellar tendon origin.
IMPRESSION: No acute osseous abnormality.

## 2016-02-13 ENCOUNTER — Encounter (HOSPITAL_COMMUNITY): Payer: Self-pay | Admitting: *Deleted

## 2016-02-13 ENCOUNTER — Emergency Department (HOSPITAL_COMMUNITY): Payer: Self-pay

## 2016-02-13 ENCOUNTER — Emergency Department (HOSPITAL_COMMUNITY)
Admission: EM | Admit: 2016-02-13 | Discharge: 2016-02-13 | Disposition: A | Discharge status: Home / Self Care | Payer: Self-pay | Attending: Emergency Medicine | Admitting: Emergency Medicine

## 2016-02-13 DIAGNOSIS — M545 Low back pain: Secondary | ICD-10-CM | POA: Insufficient documentation

## 2016-02-13 DIAGNOSIS — J069 Acute upper respiratory infection, unspecified: Secondary | ICD-10-CM | POA: Insufficient documentation

## 2016-02-13 DIAGNOSIS — J45909 Unspecified asthma, uncomplicated: Secondary | ICD-10-CM | POA: Insufficient documentation

## 2016-02-13 DIAGNOSIS — G8929 Other chronic pain: Secondary | ICD-10-CM

## 2016-02-13 DIAGNOSIS — M7662 Achilles tendinitis, left leg: Secondary | ICD-10-CM | POA: Insufficient documentation

## 2016-02-13 DIAGNOSIS — M7661 Achilles tendinitis, right leg: Secondary | ICD-10-CM | POA: Insufficient documentation

## 2016-02-13 HISTORY — DX: Obesity, unspecified: E66.9

## 2016-02-13 MED ORDER — GUAIFENESIN ER 1200 MG PO TB12: 1.0000 | ORAL_TABLET | Freq: Two times a day (BID) | ORAL | 0 refills | Status: DC

## 2016-02-13 MED ORDER — HYDROCODONE-ACETAMINOPHEN 5-325 MG PO TABS: 1.0000 | ORAL_TABLET | Freq: Four times a day (QID) | ORAL | 0 refills | Status: DC | PRN

## 2016-02-13 MED ORDER — PREDNISONE 50 MG PO TABS: 50.0000 mg | ORAL_TABLET | Freq: Every day | ORAL | 0 refills | Status: DC

## 2016-02-13 NOTE — ED Triage Notes (Signed)
Pt has multiple complaints. Pt has bilateral foot pain, lower back pain and bilateral ear pain. Denies any new injuries. No acute distress noted at triage.

## 2016-02-13 NOTE — ED Provider Notes (Signed)
[facility] DEPT Provider Note   CSN: [id number] Arrival date & time: 02/13/16  [id number]  By signing my name below, I, Soijett Blue, attest that this documentation has been prepared under the direction and in the presence of Irena Cords, PA-C Electronically Signed: Soijett Blue, ED Scribe. 02/13/16. 11:15 AM.  History   Chief Complaint Chief Complaint  Patient presents with  . Back Pain  . Foot Pain  . Otalgia    HPI  Tracy Rosales is a 40 y.o. female wwho presents to the Emergency Department complaining of lower back pain onset 1 month ago. She states that she has not tried any medications for the relief of her symptoms. Pt denies color change, wound, gait problem, and any other symptoms. Pt states that she called out of work today to be with her sick grandmother and was informed that she would need a work note in order to return to work.  Pt secondarily complains of bilateral foot pain onset 1 month ago. Pt states that she has been standing for prolonged periods of time for the past month at her new job at [employer] prior to the onset of her symptoms. Pt reports that she has a tightness sensation to her bilateral feet. Pt notes that she has a hx of plantar fascitis. Pt hasn't tried any medications for the relief of her symptoms. Pt denies any other symptoms.   Pt thirdly complains of bilateral ear pain onset 2 days ago. Pt states that there is a pressure sensation to her bilateral ears. Pt has associated symptoms of sore throat x 2 days and nasal congestion. Pt hasn't tried any medications for the relief of her symptoms. Pt denies fever, chills, and any other symptoms.  The history is provided by the patient. No language interpreter was used.    Past Medical History:  Diagnosis Date  . Asthma   . Bronchitis   . Depression   . Headache(784.0)   . Obesity   . Uterine fibroid     Patient Active Problem List   Diagnosis Date Noted  . Pelvic pain in female 10/13/2011    Past  Surgical History:  Procedure Laterality Date  . CESAREAN SECTION    . DILATION AND CURETTAGE OF UTERUS     for heavy periods     OB History    Gravida Para Term Preterm AB Living   2 2 2     2    SAB TAB Ectopic Multiple Live Births                   Home Medications    Prior to Admission medications   Not on File    Family History History reviewed. No pertinent family history.  Social History Social History  Substance Use Topics  . Smoking status: Never Smoker  . Smokeless tobacco: Never Used  . Alcohol use 1.2 oz/week    1 Glasses of wine, 1 Cans of beer per week     Comment: occasional     Allergies   Patient has no known allergies.   Review of Systems Review of Systems  Constitutional: Negative for chills and fever.  Musculoskeletal: Positive for arthralgias (bilateral feet) and back pain (lower).  Skin: Negative for color change and wound.     Physical Exam Updated Vital Signs BP 124/76 (BP Location: Left Arm)   Pulse 70   Temp 98.2 F (36.8 C) (Oral)   Resp 14   Ht 5\' 4"  (1.626 m)   Wt  266 lb 5 oz (120.8 kg)   SpO2 96%   BMI 45.71 kg/m   Physical Exam  Constitutional: She is oriented to person, place, and time. She appears well-developed and well-nourished. No distress.  HENT:  Head: Normocephalic and atraumatic.  Eyes: Pupils are equal, round, and reactive to light.  Pulmonary/Chest: Effort normal.  Musculoskeletal:       Lumbar back: She exhibits bony tenderness.  Tenderness to midline over L2-L3. Nl sensation to BLE. Tenderness over insertion of achilles tendon bilaterally. Nl strength to BLE. Nl gait.   Neurological: She is alert and oriented to person, place, and time.  Skin: Skin is warm and dry.  Psychiatric: She has a normal mood and affect.  Nursing note and vitals reviewed.    ED Treatments / Results  DIAGNOSTIC STUDIES: Oxygen Saturation is 96% on RA, nl by my interpretation.    COORDINATION OF CARE: 11:05 AM Discussed  treatment plan with pt at bedside which includes lumbar spine xray, and pt agreed to plan.   Radiology Dg Lumbar Spine Complete  Result Date: 02/13/2016 CLINICAL DATA:  Low back pain an bilateral ear pain 1 month. Back pain for the past few weeks worse lying supine. EXAM: LUMBAR SPINE - COMPLETE 4+ VIEW COMPARISON:  None. 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 Vertebral body alignment, heights and disc space heights are within normal. There is no compression fracture or subluxation. There is minimal spondylosis over the lower thoracic spine. IMPRESSION: No acute findings. Electronically Signed   By: Marin Olp M.D.   On: 02/13/2016 11:10    Procedures Procedures (including critical care time)  Medications Ordered in ED Medications - No data to display   Initial Impression / Assessment and Plan / ED Course  I have reviewed the triage vital signs and the nursing notes.  Pertinent imaging results that were available during my care of the patient were reviewed by me and considered in my medical decision making (see chart for details).  Clinical Course       Final Clinical Impressions(s) / ED Diagnoses   Final diagnoses:  None    New Prescriptions New Prescriptions   No medications on file   I personally performed the services described in this documentation, which was scribed in my presence. The recorded information has been reviewed and is accurate.    Dalia Heading, PA-C 02/17/16 0230    Lacretia Leigh, MD 02/19/16 1024

## 2016-02-13 NOTE — Discharge Instructions (Signed)
REturn here as needed. Follow up with your doctor. Ice and heat on the areas that are sore.

## 2016-03-31 ENCOUNTER — Emergency Department (HOSPITAL_COMMUNITY)
Admission: EM | Admit: 2016-03-31 | Discharge: 2016-03-31 | Disposition: A | Discharge status: Home / Self Care | Payer: Self-pay | Attending: Emergency Medicine | Admitting: Emergency Medicine

## 2016-03-31 ENCOUNTER — Emergency Department (HOSPITAL_COMMUNITY): Payer: Self-pay

## 2016-03-31 ENCOUNTER — Encounter (HOSPITAL_COMMUNITY): Payer: Self-pay | Admitting: Emergency Medicine

## 2016-03-31 DIAGNOSIS — J45909 Unspecified asthma, uncomplicated: Secondary | ICD-10-CM | POA: Insufficient documentation

## 2016-03-31 DIAGNOSIS — R079 Chest pain, unspecified: Secondary | ICD-10-CM | POA: Insufficient documentation

## 2016-03-31 LAB — BASIC METABOLIC PANEL
Anion gap: 7 (ref 5–15)
BUN: 9 mg/dL (ref 6–20)
CO2: 25 mmol/L (ref 22–32)
Calcium: 9 mg/dL (ref 8.9–10.3)
Chloride: 109 mmol/L (ref 101–111)
Creatinine, Ser: 0.82 mg/dL (ref 0.44–1.00)
GFR calc Af Amer: >60 mL/min (ref >60)
GFR calc non Af Amer: >60 mL/min (ref >60)
Glucose, Bld: 129 mg/dL — ABNORMAL HIGH (ref 65–99)
Potassium: 4 mmol/L (ref 3.5–5.1)
Sodium: 141 mmol/L (ref 135–145)

## 2016-03-31 LAB — CBC
HCT: 38.5 % (ref 36.0–46.0)
Hemoglobin: 12.1 g/dL (ref 12.0–15.0)
MCH: 28.3 pg (ref 26.0–34.0)
MCHC: 31.4 g/dL (ref 30.0–36.0)
MCV: 90.2 fL (ref 78.0–100.0)
Platelets: 384 10*3/uL (ref 150–400)
RBC: 4.27 MIL/uL (ref 3.87–5.11)
RDW: 14.4 % (ref 11.5–15.5)
WBC: 6.3 10*3/uL (ref 4.0–10.5)

## 2016-03-31 LAB — I-STAT TROPONIN, ED: Troponin i, poc: 0 ng/mL (ref 0.00–0.08)

## 2016-03-31 MED ORDER — KETOROLAC TROMETHAMINE 15 MG/ML IJ SOLN
30.0000 mg | Freq: Once | INTRAMUSCULAR | Status: AC
  Administered 2016-03-31: 30 mg via INTRAMUSCULAR
  Filled 2016-03-31: qty 2

## 2016-03-31 MED ORDER — GI COCKTAIL ~~LOC~~
30.0000 mL | Freq: Once | ORAL | Status: AC
  Administered 2016-03-31: 30 mL via ORAL
  Filled 2016-03-31: qty 30

## 2016-03-31 MED ORDER — KETOROLAC TROMETHAMINE 15 MG/ML IJ SOLN
INTRAMUSCULAR | Status: AC
  Filled 2016-03-31: qty 1

## 2016-03-31 NOTE — ED Triage Notes (Signed)
Pt now reports having chest pains in right side of chest and under breast. Denies SOB, diaphoresis.

## 2016-03-31 NOTE — ED Triage Notes (Signed)
Pt states she started having bodyaches, ear ache, stiffness on right side- feels like she can't move her right arm up due to muscle pain and back of her neck. Pt states she has had some intermittent nausea. Pt also complains of nonproductive cough.

## 2016-04-06 ENCOUNTER — Encounter (HOSPITAL_COMMUNITY): Payer: Self-pay | Admitting: Emergency Medicine

## 2016-04-06 ENCOUNTER — Emergency Department (HOSPITAL_COMMUNITY)
Admission: EM | Admit: 2016-04-06 | Discharge: 2016-04-06 | Disposition: A | Discharge status: Home / Self Care | Payer: Medicaid Other | Attending: Emergency Medicine | Admitting: Emergency Medicine

## 2016-04-06 DIAGNOSIS — B349 Viral infection, unspecified: Secondary | ICD-10-CM

## 2016-04-06 DIAGNOSIS — J45909 Unspecified asthma, uncomplicated: Secondary | ICD-10-CM | POA: Insufficient documentation

## 2016-04-06 LAB — URINALYSIS, ROUTINE W REFLEX MICROSCOPIC
Bilirubin Urine: NEGATIVE
Glucose, UA: NEGATIVE mg/dL
Ketones, ur: NEGATIVE mg/dL
Leukocytes, UA: NEGATIVE
Nitrite: NEGATIVE
Protein, ur: NEGATIVE mg/dL
Specific Gravity, Urine: 1.026 (ref 1.005–1.030)
pH: 5 (ref 5.0–8.0)

## 2016-04-06 LAB — CBC
HCT: 36.8 % (ref 36.0–46.0)
Hemoglobin: 11.8 g/dL — ABNORMAL LOW (ref 12.0–15.0)
MCH: 27.7 pg (ref 26.0–34.0)
MCHC: 32.1 g/dL (ref 30.0–36.0)
MCV: 86.4 fL (ref 78.0–100.0)
Platelets: 296 10*3/uL (ref 150–400)
RBC: 4.26 MIL/uL (ref 3.87–5.11)
RDW: 14.1 % (ref 11.5–15.5)
WBC: 6.3 10*3/uL (ref 4.0–10.5)

## 2016-04-06 LAB — COMPREHENSIVE METABOLIC PANEL
ALT: 41 U/L (ref 14–54)
AST: 37 U/L (ref 15–41)
Albumin: 4 g/dL (ref 3.5–5.0)
Alkaline Phosphatase: 48 U/L (ref 38–126)
Anion gap: 6 (ref 5–15)
BUN: 13 mg/dL (ref 6–20)
CO2: 25 mmol/L (ref 22–32)
Calcium: 8.6 mg/dL — ABNORMAL LOW (ref 8.9–10.3)
Chloride: 107 mmol/L (ref 101–111)
Creatinine, Ser: 0.77 mg/dL (ref 0.44–1.00)
GFR calc Af Amer: >60 mL/min (ref >60)
GFR calc non Af Amer: >60 mL/min (ref >60)
Glucose, Bld: 107 mg/dL — ABNORMAL HIGH (ref 65–99)
Potassium: 4.1 mmol/L (ref 3.5–5.1)
Sodium: 138 mmol/L (ref 135–145)
Total Bilirubin: 0.4 mg/dL (ref 0.3–1.2)
Total Protein: 7.1 g/dL (ref 6.5–8.1)

## 2016-04-06 LAB — I-STAT BETA HCG BLOOD, ED (MC, WL, AP ONLY): I-stat hCG, quantitative: <5 m[IU]/mL (ref <5)

## 2016-04-06 LAB — LIPASE, BLOOD: Lipase: 24 U/L (ref 11–51)

## 2016-04-06 MED ORDER — IBUPROFEN 800 MG PO TABS: 800.0000 mg | ORAL_TABLET | Freq: Three times a day (TID) | ORAL | 0 refills | Status: DC | PRN

## 2016-04-06 MED ORDER — ONDANSETRON 4 MG PO TBDP
4.0000 mg | ORAL_TABLET | Freq: Once | ORAL | Status: AC | PRN
  Administered 2016-04-06: 4 mg via ORAL
  Filled 2016-04-06: qty 1

## 2016-04-06 MED ORDER — METOCLOPRAMIDE HCL 5 MG/ML IJ SOLN: 10.0000 mg | Freq: Once | INTRAMUSCULAR | Status: DC

## 2016-04-06 MED ORDER — DIPHENHYDRAMINE HCL 25 MG PO CAPS
25.0000 mg | ORAL_CAPSULE | Freq: Once | ORAL | Status: AC
  Administered 2016-04-06: 25 mg via ORAL
  Filled 2016-04-06: qty 1

## 2016-04-06 MED ORDER — ONDANSETRON 4 MG PO TBDP: 4.0000 mg | ORAL_TABLET | Freq: Three times a day (TID) | ORAL | 0 refills | Status: DC | PRN

## 2016-04-06 MED ORDER — SODIUM CHLORIDE 0.9 % IV BOLUS (SEPSIS): 1000.0000 mL | Freq: Once | INTRAVENOUS | Status: DC

## 2016-04-06 MED ORDER — METOCLOPRAMIDE HCL 10 MG PO TABS
10.0000 mg | ORAL_TABLET | Freq: Once | ORAL | Status: AC
  Administered 2016-04-06: 10 mg via ORAL
  Filled 2016-04-06: qty 1

## 2016-04-06 MED ORDER — DIPHENHYDRAMINE HCL 50 MG/ML IJ SOLN: 12.5000 mg | Freq: Once | INTRAMUSCULAR | Status: DC

## 2016-04-06 MED ORDER — IBUPROFEN 800 MG PO TABS
800.0000 mg | ORAL_TABLET | Freq: Once | ORAL | Status: AC
  Administered 2016-04-06: 800 mg via ORAL
  Filled 2016-04-06: qty 1

## 2016-04-06 MED ORDER — KETOROLAC TROMETHAMINE 30 MG/ML IJ SOLN
30.0000 mg | Freq: Once | INTRAMUSCULAR | Status: DC
Start: 2016-04-06 — End: 2016-04-06

## 2016-04-06 NOTE — Discharge Instructions (Signed)
Zofran as needed for nausea. It is very important to stay hydrated, drinking small sips of water off throughout the day. Ibuprofen as needed for headache/body aches. Return to ER if you are unable to keep liquids down, new or worsening symptoms develop or you have any additional concerns. I would encourage you to follow up with your primary care provider for further discussion of today's visit.

## 2016-04-06 NOTE — ED Provider Notes (Signed)
[facility] DEPT Provider Note   CSN: [id number] Arrival date & time: 04/06/16  0801     History   Chief Complaint Chief Complaint  Patient presents with  . Emesis  . Headache    HPI Chiyo Marron is a 41 y.o. female.  The history is provided by the patient and medical records. No language interpreter was used.  Emesis   Associated symptoms include chills, diarrhea, headaches and myalgias. Pertinent negatives include no abdominal pain and no fever.  Headache   Associated symptoms include nausea and vomiting. Pertinent negatives include no fever and no shortness of breath.   Coeta Araque is a 41 y.o. female  with a PMH of asthma who presents to the Emergency Department complaining of acute onset of generalized body aches, nausea, vomiting, non-bloody diarrhea and headache x 1 day. Patient states that she works at [employer] and numerous employees have been sick with similar symptoms. No medications taken prior to arrival for symptoms. She has been unable to keep any type of solid food down, but has been able to take sips of fluids. No objective fever, but she has been experiencing chills. Denies flu shot this year.   Past Medical History:  Diagnosis Date  . Asthma   . Bronchitis   . Depression   . Headache(784.0)   . Obesity   . Uterine fibroid     Patient Active Problem List   Diagnosis Date Noted  . Pelvic pain in female 10/13/2011    Past Surgical History:  Procedure Laterality Date  . CESAREAN SECTION    . DILATION AND CURETTAGE OF UTERUS     for heavy periods     OB History    Gravida Para Term Preterm AB Living   2 2 2     2    SAB TAB Ectopic Multiple Live Births                   Home Medications    Prior to Admission medications   Medication Sig Start Date End Date Taking? Authorizing Provider  diphenhydramine-acetaminophen (TYLENOL PM) 25-500 MG TABS tablet Take 1 tablet by mouth as needed (for pain).   Yes Historical Provider, MD  Guaifenesin 1200  MG TB12 Take 1 tablet (1,200 mg total) by mouth 2 (two) times daily. Patient not taking: Reported on 03/31/2016 02/13/16   Dalia Heading, PA-C  HYDROcodone-acetaminophen (NORCO/VICODIN) 5-325 MG tablet Take 1 tablet by mouth every 6 (six) hours as needed for moderate pain. Patient not taking: Reported on 03/31/2016 02/13/16   Dalia Heading, PA-C  ibuprofen (ADVIL,MOTRIN) 800 MG tablet Take 1 tablet (800 mg total) by mouth every 8 (eight) hours as needed. 04/06/16   Ozella Almond Ward, PA-C  ondansetron (ZOFRAN ODT) 4 MG disintegrating tablet Take 1 tablet (4 mg total) by mouth every 8 (eight) hours as needed for nausea or vomiting. 04/06/16   Ozella Almond Ward, PA-C  predniSONE (DELTASONE) 50 MG tablet Take 1 tablet (50 mg total) by mouth daily with breakfast. Patient not taking: Reported on 03/31/2016 02/13/16   Dalia Heading, PA-C    Family History History reviewed. No pertinent family history.  Social History Social History  Substance Use Topics  . Smoking status: Never Smoker  . Smokeless tobacco: Never Used  . Alcohol use 1.2 oz/week    1 Glasses of wine, 1 Cans of beer per week     Comment: occasional     Allergies   Patient has no known allergies.   Review  of Systems Review of Systems  Constitutional: Positive for chills. Negative for fever.  HENT: Negative for sore throat.   Eyes: Negative for visual disturbance.  Respiratory: Negative for shortness of breath.   Cardiovascular: Negative.   Gastrointestinal: Positive for diarrhea, nausea and vomiting. Negative for abdominal pain, blood in stool and constipation.  Genitourinary: Negative for dysuria.  Musculoskeletal: Positive for myalgias. Negative for neck pain.  Skin: Negative for rash.  Neurological: Positive for headaches. Negative for syncope.     Physical Exam Updated Vital Signs BP 120/88 (BP Location: Right Arm)   Pulse 106   Temp 99.2 F (37.3 C) (Oral)   Resp 16   Ht 5\' 4"  (1.626 m)   Wt  117.9 kg   LMP 03/14/2016   SpO2 96%   BMI 44.63 kg/m   Physical Exam  Constitutional: She is oriented to person, place, and time. She appears well-developed and well-nourished. No distress.  HENT:  Head: Normocephalic and atraumatic.  Mouth/Throat: No oropharyngeal exudate.  Cardiovascular: Normal heart sounds.   No murmur heard. Regular rate and rhythm on exam.  Pulmonary/Chest: Effort normal and breath sounds normal. No respiratory distress. She has no wheezes. She has no rales.  Abdominal: Soft. Bowel sounds are normal. She exhibits no distension.  Minimal generalized tenderness without focality. No rebound or guarding. No CVA tenderness.  Musculoskeletal: She exhibits no edema.  Neurological: She is alert and oriented to person, place, and time.  CN II-XII grossly intact. Speech clear and goal oriented. Finger to nose and rapid alternating movements normal. 5/5 muscle strength in all 4 extremities including grip strength.  Skin: Skin is warm and dry.  Nursing note and vitals reviewed.    ED Treatments / Results  Labs (all labs ordered are listed, but only abnormal results are displayed) Labs Reviewed  COMPREHENSIVE METABOLIC PANEL - Abnormal; Notable for the following:       Result Value   Glucose, Bld 107 (*)    Calcium 8.6 (*)    All other components within normal limits  CBC - Abnormal; Notable for the following:    Hemoglobin 11.8 (*)    All other components within normal limits  LIPASE, BLOOD  URINALYSIS, ROUTINE W REFLEX MICROSCOPIC  I-STAT BETA HCG BLOOD, ED ([facility], [facility], [facility] ONLY)    EKG  EKG Interpretation None       Radiology No results found.  Procedures Procedures (including critical care time)  Medications Ordered in ED Medications  ondansetron (ZOFRAN-ODT) disintegrating tablet 4 mg (4 mg Oral Given 04/06/16 0824)  metoCLOPramide (REGLAN) tablet 10 mg (10 mg Oral Given 04/06/16 1031)  diphenhydrAMINE (BENADRYL) capsule 25 mg (25 mg Oral Given 04/06/16  1032)  ibuprofen (ADVIL,MOTRIN) tablet 800 mg (800 mg Oral Given 04/06/16 1031)     Initial Impression / Assessment and Plan / ED Course  I have reviewed the triage vital signs and the nursing notes.  Pertinent labs & imaging results that were available during my care of the patient were reviewed by me and considered in my medical decision making (see chart for details).    Sonna Vandergriff is a 41 y.o. female who presents to ED for headache, generalized body aches, chills, n/v/d. Patient is afebrile, regular heart rate on exam with nonsurgical abdomen. No focal neuro deficits. Likely viral etiology. Lab work reviewed and reassuring. Patient was given ibuprofen, reglan and Benadryl. On reevaluation, patient has appointment improvement in her symptoms. She is tolerating PO and feels comfortable with discharged home. Symptomatic  home care instructions and follow-up care discussed. Reasons for return to ED discussed and all questions answered.  Final Clinical Impressions(s) / ED Diagnoses   Final diagnoses:  Viral illness    New Prescriptions New Prescriptions   IBUPROFEN (ADVIL,MOTRIN) 800 MG TABLET    Take 1 tablet (800 mg total) by mouth every 8 (eight) hours as needed.   ONDANSETRON (ZOFRAN ODT) 4 MG DISINTEGRATING TABLET    Take 1 tablet (4 mg total) by mouth every 8 (eight) hours as needed for nausea or vomiting.     [facility] Ward, PA-C 04/06/16 1235    Tanna Furry, MD 04/14/16 2312

## 2016-04-06 NOTE — ED Triage Notes (Signed)
Pt reports n/v/d, HA and body aches since midnight.

## 2016-04-06 NOTE — ED Notes (Signed)
Patient informed this Probation officer where they would like to be stuck and with what size needle, patient then stated it hurt too much when stuck. Unsuccessful lab draw x1. Patient requests to be stuck once for lab draws with  IV access. RN Apolonio Schneiders made aware. Patient was informed urine sample would be needed as well.

## 2016-04-06 NOTE — Progress Notes (Signed)
Pt had been seen by [facility] staff Stacy and ED CM prior to d/c CM spoke with pt who confirms uninsured [employer] resident with no pcp.  CM discussed and provided written information to assist pt with determining choice for uninsured accepting pcps, discussed the importance of pcp vs EDP services for f/u care, www.needymeds.org, www.goodrx.com, discounted pharmacies and other [employer] such as [employer], affordable care act, financial assistance, uninsured dental services, [place] med assist, DSS and  health department  Reviewed resources for [employer] uninsured accepting pcps like [facility], family medicine at [employer], [facility], [facility], local urgent care centers, [facility], [facility] family practice, general medical clinics, family services of the [place], [facility] urgent care plus others, medication resources, [facility] out patient pharmacies and housing Pt voiced understanding and appreciation of resources provided   Provided [facility] contact information Pt agreed to a referral Cm completed referral Pt to be contact by [facility] clinical liaison  ED CM left pt uninsured [place] resources in his locker #42 CM spoke with pt who confirms uninsured [employer] resident with no pcp.  CM provided written information to assist pt with determining choice for uninsured accepting pcps, discussed the importance of pcp vs EDP services for f/u care, www.needymeds.org, www.goodrx.com, discounted pharmacies and other [employer] such as [employer] , [employer], affordable care act, financial assistance, uninsured dental services, [place] med assist, DSS and  health department  Provided resources for [employer] uninsured accepting pcps like [facility], family medicine at [employer], [facility], [facility], local urgent care centers, [facility], [facility] family practice, general medical clinics, family services of the  [place], [facility] urgent care plus others, medication resources, [facility] out [facility]ies and housing Provided [employer]

## 2016-04-06 NOTE — ED Notes (Signed)
Patient is refusing an IV, would prefer medication by mouth

## 2016-04-12 NOTE — ED Provider Notes (Signed)
[facility] DEPT Provider Note   CSN: [id number] Arrival date & time: 03/31/16  0725     History   Chief Complaint Chief Complaint  Patient presents with  . Cough  . Neck Pain  . Chest Pain    HPI Tracy Rosales is a 41 y.o. female.  HPI   41 year old female with multiple complaints. For the past 2 days she is having generalized body aches, intermittent nausea, cough and bilateral earaches. Patient began having chest pains particularly underneath her right breast. This is her Pearlie Oyster her to the emergency room today. Denies shortness of breath. Her cough is nonproductive. No fevers, but has felt chilled.  Past Medical History:  Diagnosis Date  . Asthma   . Bronchitis   . Depression   . Headache(784.0)   . Obesity   . Uterine fibroid     Patient Active Problem List   Diagnosis Date Noted  . Pelvic pain in female 10/13/2011    Past Surgical History:  Procedure Laterality Date  . CESAREAN SECTION    . DILATION AND CURETTAGE OF UTERUS     for heavy periods     OB History    Gravida Para Term Preterm AB Living   2 2 2     2    SAB TAB Ectopic Multiple Live Births                   Home Medications    Prior to Admission medications   Medication Sig Start Date End Date Taking? Authorizing Provider  diphenhydramine-acetaminophen (TYLENOL PM) 25-500 MG TABS tablet Take 1 tablet by mouth as needed (for pain).   Yes Historical Provider, MD  Guaifenesin 1200 MG TB12 Take 1 tablet (1,200 mg total) by mouth 2 (two) times daily. Patient not taking: Reported on 03/31/2016 02/13/16   Dalia Heading, PA-C  HYDROcodone-acetaminophen (NORCO/VICODIN) 5-325 MG tablet Take 1 tablet by mouth every 6 (six) hours as needed for moderate pain. Patient not taking: Reported on 03/31/2016 02/13/16   Dalia Heading, PA-C  ibuprofen (ADVIL,MOTRIN) 800 MG tablet Take 1 tablet (800 mg total) by mouth every 8 (eight) hours as needed. 04/06/16   Ozella Almond Ward, PA-C  ondansetron  (ZOFRAN ODT) 4 MG disintegrating tablet Take 1 tablet (4 mg total) by mouth every 8 (eight) hours as needed for nausea or vomiting. 04/06/16   Ozella Almond Ward, PA-C  predniSONE (DELTASONE) 50 MG tablet Take 1 tablet (50 mg total) by mouth daily with breakfast. Patient not taking: Reported on 03/31/2016 02/13/16   Dalia Heading, PA-C    Family History History reviewed. No pertinent family history.  Social History Social History  Substance Use Topics  . Smoking status: Never Smoker  . Smokeless tobacco: Never Used  . Alcohol use 1.2 oz/week    1 Glasses of wine, 1 Cans of beer per week     Comment: occasional     Allergies   Patient has no known allergies.   Review of Systems Review of Systems  All systems reviewed and negative, other than as noted in HPI.   Physical Exam Updated Vital Signs BP 108/82   Pulse 68   Temp 98.6 F (37 C) (Oral)   Resp 16   Ht 5\' 4"  (1.626 m)   Wt 258 lb (117 kg)   LMP 03/14/2016   SpO2 98%   BMI 44.29 kg/m   Physical Exam  Constitutional: She appears well-developed and well-nourished. No distress.  HENT:  Head: Normocephalic and atraumatic.  Eyes: Conjunctivae are normal. Right eye exhibits no discharge. Left eye exhibits no discharge.  Neck: Neck supple.  Cardiovascular: Normal rate, regular rhythm and normal heart sounds.  Exam reveals no gallop and no friction rub.   No murmur heard. Pulmonary/Chest: Effort normal and breath sounds normal. No respiratory distress.  Abdominal: Soft. She exhibits no distension. There is no tenderness.  Musculoskeletal: She exhibits no edema or tenderness.  Neurological: She is alert.  Skin: Skin is warm and dry.  Psychiatric: She has a normal mood and affect. Her behavior is normal. Thought content normal.  Nursing note and vitals reviewed.    ED Treatments / Results  Labs (all labs ordered are listed, but only abnormal results are displayed) Labs Reviewed  BASIC METABOLIC PANEL -  Abnormal; Notable for the following:       Result Value   Glucose, Bld 129 (*)    All other components within normal limits  CBC  I-STAT TROPOININ, ED    EKG  EKG Interpretation  Date/Time:  Friday March 31 2016 07:52:38 EST Ventricular Rate:  75 PR Interval:  146 QRS Duration: 76 QT Interval:  364 QTC Calculation: 406 R Axis:   10 Text Interpretation:  Normal sinus rhythm Non-specific ST-t changes Confirmed by Wilson Singer  MD, Baneen Wieseler [id number]) on 03/31/2016 9:17:16 AM       Radiology No results found.  Procedures Procedures (including critical care time)  Medications Ordered in ED Medications  ketorolac (TORADOL) 15 MG/ML injection 30 mg (30 mg Intramuscular Given 03/31/16 1026)  gi cocktail (Maalox,Lidocaine,Donnatal) (30 mLs Oral Given 03/31/16 1025)     Initial Impression / Assessment and Plan / ED Course  I have reviewed the triage vital signs and the nursing notes.  Pertinent labs & imaging results that were available during my care of the patient were reviewed by me and considered in my medical decision making (see chart for details).      Final Clinical Impressions(s) / ED Diagnoses   Final diagnoses:  Chest pain, unspecified type    New Prescriptions Discharge Medication List as of 03/31/2016 10:14 AM       Virgel Manifold, MD 04/12/16 1329

## 2016-09-05 ENCOUNTER — Ambulatory Visit: Payer: Medicaid Other | Admitting: Family

## 2016-09-21 ENCOUNTER — Inpatient Hospital Stay (HOSPITAL_COMMUNITY)
Admission: AD | Admit: 2016-09-21 | Discharge: 2016-09-21 | Disposition: A | Discharge status: Home / Self Care | Payer: Self-pay | Source: Ambulatory Visit | Attending: Obstetrics and Gynecology | Admitting: Obstetrics and Gynecology

## 2016-09-21 ENCOUNTER — Encounter (HOSPITAL_COMMUNITY): Payer: Self-pay | Admitting: *Deleted

## 2016-09-21 DIAGNOSIS — Z86018 Personal history of other benign neoplasm: Secondary | ICD-10-CM

## 2016-09-21 DIAGNOSIS — R1030 Lower abdominal pain, unspecified: Secondary | ICD-10-CM | POA: Insufficient documentation

## 2016-09-21 DIAGNOSIS — Z3202 Encounter for pregnancy test, result negative: Secondary | ICD-10-CM | POA: Insufficient documentation

## 2016-09-21 DIAGNOSIS — N939 Abnormal uterine and vaginal bleeding, unspecified: Secondary | ICD-10-CM | POA: Insufficient documentation

## 2016-09-21 DIAGNOSIS — R102 Pelvic and perineal pain: Secondary | ICD-10-CM | POA: Insufficient documentation

## 2016-09-21 DIAGNOSIS — Z113 Encounter for screening for infections with a predominantly sexual mode of transmission: Secondary | ICD-10-CM

## 2016-09-21 HISTORY — DX: Unspecified abnormal cytological findings in specimens from vagina: R87.629

## 2016-09-21 LAB — CBC
HCT: 36.1 % (ref 36.0–46.0)
Hemoglobin: 11.7 g/dL — ABNORMAL LOW (ref 12.0–15.0)
MCH: 29.1 pg (ref 26.0–34.0)
MCHC: 32.4 g/dL (ref 30.0–36.0)
MCV: 89.8 fL (ref 78.0–100.0)
Platelets: 349 10*3/uL (ref 150–400)
RBC: 4.02 MIL/uL (ref 3.87–5.11)
RDW: 13.9 % (ref 11.5–15.5)
WBC: 7.6 10*3/uL (ref 4.0–10.5)

## 2016-09-21 LAB — WET PREP, GENITAL
Sperm: NONE SEEN
Trich, Wet Prep: NONE SEEN
Yeast Wet Prep HPF POC: NONE SEEN

## 2016-09-21 LAB — URINALYSIS, ROUTINE W REFLEX MICROSCOPIC
Bacteria, UA: NONE SEEN
Bilirubin Urine: NEGATIVE
Glucose, UA: NEGATIVE mg/dL
Ketones, ur: NEGATIVE mg/dL
Leukocytes, UA: NEGATIVE
Nitrite: NEGATIVE
Protein, ur: NEGATIVE mg/dL
Specific Gravity, Urine: 1.023 (ref 1.005–1.030)
pH: 5 (ref 5.0–8.0)

## 2016-09-21 LAB — POCT PREGNANCY, URINE: Preg Test, Ur: NEGATIVE

## 2016-09-21 MED ORDER — TRAMADOL HCL 50 MG PO TABS: 50.0000 mg | ORAL_TABLET | Freq: Three times a day (TID) | ORAL | 0 refills | Status: DC | PRN

## 2016-09-21 MED ORDER — KETOROLAC TROMETHAMINE 60 MG/2ML IM SOLN
60.0000 mg | Freq: Once | INTRAMUSCULAR | Status: AC
  Administered 2016-09-21: 60 mg via INTRAMUSCULAR
  Filled 2016-09-21: qty 2

## 2016-09-21 NOTE — MAU Provider Note (Signed)
History     CSN: [id number]  Arrival date and time: 09/21/16 7322  First Provider Initiated Contact with Patient 09/21/16 2052     Chief Complaint  Patient presents with  . Abdominal Pain  . Vaginal Bleeding   HPI Tracy Rosales is a 41 y.o. G2P2002 non pregnant female who presents with abdominal pain and vaginal bleeding. LMP was 6/24. Reports vaginal spotting daily since that period ended; spotting alternates pin/red/brown. Lower abdominal pain since this morning. Pain is in lower abdomen, worse in RLQ. Pain is constant & sharp/crampy. Rates pain 8/10. Took 2 aleve this morning without relief. Denies fever/chill, n/v/d, constipation, dysuria, or vaginal discharge. Patient is sexually active with 1 partner x 6 months & does not use contraception. Has history of uterine fibroid that hasn't been assessed since 2013. Had normal pap smear 10/2015 in [place].   Past Medical History:  Diagnosis Date  . Asthma   . Bronchitis   . Depression   . Headache(784.0)   . Obesity   . Uterine fibroid   . Vaginal Pap smear, abnormal    2017    Past Surgical History:  Procedure Laterality Date  . CESAREAN SECTION    . DILATION AND CURETTAGE OF UTERUS     for heavy periods     History reviewed. No pertinent family history.  Social History  Substance Use Topics  . Smoking status: Never Smoker  . Smokeless tobacco: Never Used  . Alcohol use 1.2 oz/week    1 Glasses of wine, 1 Cans of beer per week     Comment: occasional    Allergies: No Known Allergies  Prescriptions Prior to Admission  Medication Sig Dispense Refill Last Dose  . diphenhydramine-acetaminophen (TYLENOL PM) 25-500 MG TABS tablet Take 1 tablet by mouth as needed (for pain).   More than a month at Unknown time  . Guaifenesin 1200 MG TB12 Take 1 tablet (1,200 mg total) by mouth 2 (two) times daily. (Patient not taking: Reported on 03/31/2016) 20 each 0 More than a month at Unknown time  . HYDROcodone-acetaminophen  (NORCO/VICODIN) 5-325 MG tablet Take 1 tablet by mouth every 6 (six) hours as needed for moderate pain. (Patient not taking: Reported on 03/31/2016) 15 tablet 0 More than a month at Unknown time  . ibuprofen (ADVIL,MOTRIN) 800 MG tablet Take 1 tablet (800 mg total) by mouth every 8 (eight) hours as needed. 21 tablet 0 More than a month at Unknown time  . ondansetron (ZOFRAN ODT) 4 MG disintegrating tablet Take 1 tablet (4 mg total) by mouth every 8 (eight) hours as needed for nausea or vomiting. 20 tablet 0 More than a month at Unknown time  . predniSONE (DELTASONE) 50 MG tablet Take 1 tablet (50 mg total) by mouth daily with breakfast. (Patient not taking: Reported on 03/31/2016) 5 tablet 0 More than a month at Unknown time    Review of Systems  Constitutional: Negative.   Gastrointestinal: Positive for abdominal pain. Negative for constipation, diarrhea, nausea and vomiting.  Genitourinary: Positive for vaginal bleeding. Negative for dyspareunia, dysuria and vaginal discharge.  Musculoskeletal: Negative for back pain.   Physical Exam   Blood pressure 122/87, pulse 71, temperature 98.2 F (36.8 C), temperature source Oral, resp. rate 15, height 5\' 4"  (1.626 m), weight 238 lb (108 kg), last menstrual period 08/27/2016, SpO2 100 %.  Physical Exam  Nursing note and vitals reviewed. Constitutional: She is oriented to person, place, and time. She appears well-developed and well-nourished. No distress.  HENT:  Head: Normocephalic and atraumatic.  Eyes: Conjunctivae are normal. Right eye exhibits no discharge. Left eye exhibits no discharge. No scleral icterus.  Neck: Normal range of motion.  Cardiovascular: Normal rate, regular rhythm and normal heart sounds.   No murmur heard. Respiratory: Effort normal and breath sounds normal. No respiratory distress. She has no wheezes.  GI: Soft. Bowel sounds are normal. She exhibits no distension. There is tenderness in the right lower quadrant, suprapubic  area and left lower quadrant. There is no rigidity, no rebound and no guarding.  Genitourinary: Uterus is enlarged. Cervix exhibits no motion tenderness and no friability. Right adnexum displays tenderness. Right adnexum displays no mass. Left adnexum displays tenderness. Left adnexum displays no mass. There is bleeding (small amount of dark red blood; no active bleeding) in the vagina.  Neurological: She is alert and oriented to person, place, and time.  Skin: Skin is warm and dry. She is not diaphoretic.  Psychiatric: She has a normal mood and affect. Her behavior is normal. Judgment and thought content normal.    MAU Course  Procedures Results for orders placed or performed during the hospital encounter of 09/21/16 (from the past 24 hour(s))  Urinalysis, Routine w reflex microscopic     Status: Abnormal   Collection Time: 09/21/16  8:10 PM  Result Value Ref Range   Color, Urine YELLOW YELLOW   APPearance CLEAR CLEAR   Specific Gravity, Urine 1.023 1.005 - 1.030   pH 5.0 5.0 - 8.0   Glucose, UA NEGATIVE NEGATIVE mg/dL   Hgb urine dipstick MODERATE (A) NEGATIVE   Bilirubin Urine NEGATIVE NEGATIVE   Ketones, ur NEGATIVE NEGATIVE mg/dL   Protein, ur NEGATIVE NEGATIVE mg/dL   Nitrite NEGATIVE NEGATIVE   Leukocytes, UA NEGATIVE NEGATIVE   RBC / HPF 0-5 0 - 5 RBC/hpf   WBC, UA 0-5 0 - 5 WBC/hpf   Bacteria, UA NONE SEEN NONE SEEN   Squamous Epithelial / LPF 0-5 (A) NONE SEEN   Mucous PRESENT   Pregnancy, urine POC     Status: None   Collection Time: 09/21/16  8:37 PM  Result Value Ref Range   Preg Test, Ur NEGATIVE NEGATIVE  CBC     Status: Abnormal   Collection Time: 09/21/16  9:18 PM  Result Value Ref Range   WBC 7.6 4.0 - 10.5 K/uL   RBC 4.02 3.87 - 5.11 MIL/uL   Hemoglobin 11.7 (L) 12.0 - 15.0 g/dL   HCT 36.1 36.0 - 46.0 %   MCV 89.8 78.0 - 100.0 fL   MCH 29.1 26.0 - 34.0 pg   MCHC 32.4 30.0 - 36.0 g/dL   RDW 13.9 11.5 - 15.5 %   Platelets 349 150 - 400 K/uL    MDM UPT  negative Toradol 60 mg IM CBC, HIV, RPR, GC/CT, wet prep Hemoglobin stable VSS, NAD Discussed with patient that her AUB & abdominal pain could be related to uterine fibroid & offered outpatient pelvic ultrasound to evaluate fibroids Wet prep shows clue cells -- pt denies vaginal discharge, no discharge or odor noted on exam Assessment and Plan  A: 1. History of uterine fibroid   2. Lower abdominal pain   3. Pregnancy examination or test, negative result   4. Abnormal uterine bleeding (AUB)   5. Screen for STD (sexually transmitted disease)   6. Pelvic pain in female    P: Discharge patient Rx ultram #15 Outpatient ultrasound ordered Msg to [facility] for f/u appt Discussed reasons to return  to [place] 09/21/2016, 8:51 PM

## 2016-09-21 NOTE — Discharge Instructions (Signed)
Abdominal Pain, Adult Abdominal pain can be caused by many things. Often, abdominal pain is not serious and it gets better with no treatment or by being treated at home. However, sometimes abdominal pain is serious. Your health care provider will do a medical history and a physical exam to try to determine the cause of your abdominal pain. Follow these instructions at home:  Take over-the-counter and prescription medicines only as told by your health care provider. Do not take a laxative unless told by your health care provider.  Drink enough fluid to keep your urine clear or pale yellow.  Watch your condition for any changes.  Keep all follow-up visits as told by your health care provider. This is important. Contact a health care provider if:  Your abdominal pain changes or gets worse.  You are not hungry or you lose weight without trying.  You are constipated or have diarrhea for more than 2-3 days.  You have pain when you urinate or have a bowel movement.  Your abdominal pain wakes you up at night.  Your pain gets worse with meals, after eating, or with certain foods.  You are throwing up and cannot keep anything down.  You have a fever. Get help right away if:  Your pain does not go away as soon as your health care provider told you to expect.  You cannot stop throwing up.  Your pain is only in areas of the abdomen, such as the right side or the left lower portion of the abdomen.  You have bloody or black stools, or stools that look like tar.  You have severe pain, cramping, or bloating in your abdomen.  You have signs of dehydration, such as: ? Dark urine, very little urine, or no urine. ? Cracked lips. ? Dry mouth. ? Sunken eyes. ? Sleepiness. ? Weakness. This information is not intended to replace advice given to you by your health care provider. Make sure you discuss any questions you have with your health care provider. Document Released: 11/30/2004 Document  Revised: 09/10/2015 Document Reviewed: 08/04/2015 Elsevier Interactive Patient Education  2017 Else[employer] Inc[employer]   Abnormal Uterine Bleeding Abnormal uterine bleeding means bleeding more than usual from your uterus. It can include:  Bleeding between periods.  Bleeding after sex.  Bleeding that is heavier than normal.  Periods that last longer than usual.  Bleeding after you have stopped having your period (menopause).  There are many problems that may cause this. You should see a doctor for any kind of bleeding that is not normal. Treatment depends on the cause of the bleeding. Follow these instructions at home:  Watch your condition for any changes.  Do not use tampons, douche, or have sex, if your doctor tells you not to.  Change your pads often.  Get regular well-woman exams. Make sure they include a pelvic exam and cervical cancer screening.  Keep all follow-up visits as told by your doctor. This is important. Contact a doctor if:  The bleeding lasts more than one week.  You feel dizzy at times.  You feel like you are going to throw up (nauseous).  You throw up. Get help right away if:  You pass out.  You have to change pads every hour.  You have belly (abdominal) pain.  You have a fever.  You get sweaty.  You get weak.  You passing large blood clots from your vagina. Summary  Abnormal uterine bleeding means bleeding more than usual from your uterus.  There  are many problems that may cause this. You should see a doctor for any kind of bleeding that is not normal.  Treatment depends on the cause of the bleeding. This information is not intended to replace advice given to you by your health care provider. Make sure you discuss any questions you have with your health care provider. Document Released: 12/18/2008 Document Revised: 02/15/2016 Document Reviewed: 02/15/2016 Elsevier Interactive Patient Education  2017 [employer].

## 2016-09-21 NOTE — MAU Note (Signed)
Pt reports she has been spotting after sex for the last month. States she started having lower abd pain and a burning sensation in her lower abd for the last 24 hours.

## 2016-09-22 LAB — GC/CHLAMYDIA PROBE AMP (~~LOC~~) NOT AT ARMC
Chlamydia: NEGATIVE
Neisseria Gonorrhea: POSITIVE — AB

## 2016-09-23 LAB — HIV ANTIBODY (ROUTINE TESTING W REFLEX): HIV Screen 4th Generation wRfx: NONREACTIVE

## 2016-09-23 LAB — SYPHILIS: RPR W/REFLEX TO RPR TITER AND TREPONEMAL ANTIBODIES, TRADITIONAL SCREENING AND DIAGNOSIS ALGORITHM: RPR Ser Ql: NONREACTIVE

## 2016-09-27 ENCOUNTER — Ambulatory Visit (HOSPITAL_COMMUNITY)
Admission: EM | Admit: 2016-09-27 | Discharge: 2016-09-27 | Disposition: A | Discharge status: Home / Self Care | Payer: BLUE CROSS/BLUE SHIELD | Attending: Family Medicine | Admitting: Family Medicine

## 2016-09-27 ENCOUNTER — Encounter (HOSPITAL_COMMUNITY): Payer: Self-pay | Admitting: Emergency Medicine

## 2016-09-27 DIAGNOSIS — A549 Gonococcal infection, unspecified: Secondary | ICD-10-CM | POA: Diagnosis not present

## 2016-09-27 DIAGNOSIS — R102 Pelvic and perineal pain: Secondary | ICD-10-CM | POA: Diagnosis not present

## 2016-09-27 MED ORDER — CEFTRIAXONE SODIUM 250 MG IJ SOLR
250.0000 mg | Freq: Once | INTRAMUSCULAR | Status: AC
  Administered 2016-09-27: 250 mg via INTRAMUSCULAR

## 2016-09-27 MED ORDER — CEFTRIAXONE SODIUM 250 MG IJ SOLR
INTRAMUSCULAR | Status: AC
  Filled 2016-09-27: qty 250

## 2016-09-27 MED ORDER — LIDOCAINE HCL (PF) 1 % IJ SOLN
INTRAMUSCULAR | Status: AC
  Filled 2016-09-27: qty 2

## 2016-09-27 NOTE — ED Triage Notes (Signed)
[facility].  Patient is in department for treatment

## 2016-09-27 NOTE — ED Provider Notes (Signed)
  [place]   [id number] 09/27/16 Arrival Time: Tracy Rosales  ASSESSMENT & PLAN:  Today you were diagnosed with the following: 1. Gonorrhea    You have been given the following medications today for treatment of gonorrhea:  cefTRIAXone (ROCEPHIN) injection 250 mg  Recommend no sexual intercourse for the next seven days. Partners should be treated.  Reviewed expectations re: course of current medical issues. Questions answered. Outlined signs and symptoms indicating need for more acute intervention. Patient verbalized understanding. After Visit Summary given.   SUBJECTIVE:  Tracy Rosales is a 41 y.o. female who [facility] that testing was + for gonorrhea. Here for treatment. Currently on menstrual cycle. Does not think she has had vaginal discharge. Very mild pelvic discomfort. Currently sexually active with two female partners. Occasional condom use. Afebrile. No abdominal pain. No urinary symptoms.  ROS: As per HPI.   OBJECTIVE:  Vitals:   09/27/16 1325  BP: 120/77  Pulse: 72  Resp: (!) 22  Temp: 98.6 F (37 C)  TempSrc: Oral     General appearance: alert, cooperative, appears stated age and no distress Throat: lips, mucosa, and tongue normal; teeth and gums normal Back: no CVA tenderness Abdomen: soft, non-tender; bowel sounds normal; no masses or organomegaly; no guarding or rebound tenderness GU: declines Skin: warm and dry Psychological:  Alert and cooperative. Normal mood and affect.   Allergies  Allergen Reactions  . Codeine     PMHx, SurgHx, SocialHx, Medications, and Allergies were reviewed in the Visit Navigator and updated as appropriate.       Vanessa Kick, MD 09/27/16 1349

## 2016-09-27 NOTE — ED Notes (Signed)
Call back number verified and updated in EPIC... Adv pt to not have SI until lab results comeback neg.... Also adv pt lab results will be on MyChart; instructions given .... Pt verb understanding.   

## 2016-09-27 NOTE — Discharge Instructions (Signed)
Today you were diagnosed with the following: 1. Gonorrhea    You have been given the following medications today for treatment of gonorrhea:  cefTRIAXone (ROCEPHIN) injection 250 mg  Recommend no sexual intercourse for the next seven days. Partners should be treated.

## 2016-10-01 ENCOUNTER — Other Ambulatory Visit (HOSPITAL_COMMUNITY): Payer: Self-pay | Admitting: Student

## 2016-10-06 ENCOUNTER — Ambulatory Visit (HOSPITAL_COMMUNITY): Payer: Self-pay

## 2016-10-09 ENCOUNTER — Ambulatory Visit (HOSPITAL_COMMUNITY): Admission: RE | Admit: 2016-10-09 | Payer: BLUE CROSS/BLUE SHIELD | Source: Ambulatory Visit

## 2016-10-09 ENCOUNTER — Ambulatory Visit (INDEPENDENT_AMBULATORY_CARE_PROVIDER_SITE_OTHER): Payer: BLUE CROSS/BLUE SHIELD | Admitting: Obstetrics and Gynecology

## 2016-10-09 ENCOUNTER — Encounter: Payer: Self-pay | Admitting: Obstetrics and Gynecology

## 2016-10-09 VITALS — BP 124/72 | HR 72 | Resp 16 | Ht 64.5 in | Wt 239.0 lb

## 2016-10-09 DIAGNOSIS — R102 Pelvic and perineal pain unspecified side: Secondary | ICD-10-CM

## 2016-10-09 DIAGNOSIS — N920 Excessive and frequent menstruation with regular cycle: Secondary | ICD-10-CM | POA: Diagnosis not present

## 2016-10-09 DIAGNOSIS — Z319 Encounter for procreative management, unspecified: Secondary | ICD-10-CM

## 2016-10-09 DIAGNOSIS — Z86018 Personal history of other benign neoplasm: Secondary | ICD-10-CM | POA: Diagnosis not present

## 2016-10-09 MED ORDER — METRONIDAZOLE 500 MG PO TABS: 500.0000 mg | ORAL_TABLET | Freq: Two times a day (BID) | ORAL | 0 refills | Status: DC

## 2016-10-09 MED ORDER — DOXYCYCLINE HYCLATE 100 MG PO CAPS: 100.0000 mg | ORAL_CAPSULE | Freq: Two times a day (BID) | ORAL | 0 refills | Status: DC

## 2016-10-09 NOTE — Progress Notes (Signed)
GYNECOLOGY  VISIT   HPI: 41 y.o.   Legally Separated  African American  female   [phone] with Patient's last menstrual period was 09/21/2016.   here for fibroids - patient was seen at [facility] 09/21/16 -- see EPIC for details  Here with her partner.   Seen at [facility] for pelvic pain and vaginal bleeding on 09/21/16.  Just diagnosed with gonorrhea.  Chlamydia testing was negative.  Treated with Ceftriaxone.  Also had bacterial vaginosis 09/21/16.  Not treated.  She was told she could discuss her labs results from her visit to the ER here.  Denies fever.  Loose BMs since her Ceftriaxone injection.  No dysuria.  States her fibroids are a problem. [place] in 2013 showed a 1.9 cm right sided intramural fibroid.  States she had an appointment this am for a pelvic US, but she did not go to the [facility] for this. She came here instead.   Menses are every 28 days and are heavy.  Cannot function at work due to heavy flow.  Pad change more often than hourly.  Can use both a tampon and a pad.  Stains clothing and bed sheets.   Reports lower back pain, coccyx pain, upper abdominal pain, and right sided lower abdominal pain during her cycles.  Has right sided constant pain even if not bleeding.  Ibuprofen 600 mg or Tramadol do not help with the pain.   Hx hysteroscopy with D & C x 2 - 2006 in [place] for both of them.  Hx C/S x 2.   Interested in future childbearing, so she wants fibroids removed.  Trying for pregnancy currently.   No PCP.   Urine pregnancy test - negative.  GYNECOLOGIC HISTORY: Patient's last menstrual period was 09/21/2016. Contraception:  none Menopausal hormone therapy:  n/a Last mammogram:  June 2017 - normal per patient done in [place] Last pap smear: June 2017 abnormal per patient done in [place].        OB History    Gravida Para Term Preterm AB Living   2 2 2     2    SAB TAB Ectopic Multiple Live Births                     Patient  Active Problem List   Diagnosis Date Noted  . Pelvic pain in female 10/13/2011    Past Medical History:  Diagnosis Date  . Abnormal uterine bleeding   . Asthma   . Bronchitis   . Depression   . Endometriosis   . Headache(784.0)   . Obesity   . Uterine fibroid   . Vaginal Pap smear, abnormal    2017    Past Surgical History:  Procedure Laterality Date  . CESAREAN SECTION    . DILATION AND CURETTAGE OF UTERUS     for heavy periods     No current outpatient prescriptions on file.   No current facility-administered medications for this visit.      ALLERGIES: Codeine  Family History  Problem Relation Age of Onset  . Diabetes Mother   . Hypertension Mother   . Thyroid disease Maternal Aunt   . Uterine cancer Cousin   . Migraines Son     Social History   Social History  . Marital status: Legally Separated    Spouse name: N/A  . Number of children: N/A  . Years of education: N/A   Occupational History  . Not on file.   Social  History Main Topics  . Smoking status: Never Smoker  . Smokeless tobacco: Never Used  . Alcohol use 1.2 oz/week    1 Glasses of wine, 1 Cans of beer per week     Comment: occasional  . Drug use: No  . Sexual activity: Yes    Birth control/ protection: None   Other Topics Concern  . Not on file   Social History Narrative  . No narrative on file    ROS:  Pertinent items are noted in HPI.  PHYSICAL EXAMINATION:    BP 124/72   Pulse 72   Resp 16   Ht 5' 4.5" (1.638 m)   Wt 239 lb (108.4 kg)   LMP 09/21/2016   BMI 40.39 kg/m     General appearance: alert, cooperative and appears stated age Head: Normocephalic, without obvious abnormality, atraumatic Lungs: clear to auscultation bilaterally Heart: regular rate and rhythm Abdomen: soft, mildly tender lower abdomen with no guarding or rebound, no masses, no organomegaly Extremities: extremities normal, atraumatic, no cyanosis or edema Skin: Skin color, texture, turgor  normal. No rashes or lesions Lymph nodes: Cervical, supraclavicular, and axillary nodes normal. No abnormal inguinal nodes palpated Neurologic: Grossly normal  Pelvic: External genitalia:  no lesions              Urethra:  normal appearing urethra with no masses, tenderness or lesions              Bartholins and Skenes: normal                 Vagina: normal appearing vagina with normal color and discharge, no lesions              Cervix: no lesions.  Cervical motion tenderness present.                Bimanual Exam:  Uterus:  normal size, contour, position, consistency, mobility, mildly tender.              Adnexa: no mass or fullness, bilateral adnexal tenderness.              Rectal exam: declined.  Chaperone was present for exam.  ASSESSMENT Pelvic pain.  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  She also has a history of pelvic pain on chart review so she may have some underlying endometriosis.  Recent gonorrhea treated with Ceftriaxone. BV untreated.  Desire for fertility.  AMA status. Hx fibroid.  PLAN  We discussed importance of treatment for pelvic inflammatory disease.  Doxycycline 100 mg po bid x 14 days and Flagyl 500 mg po bid x 14 days.  ETOH precautions given. We discussed fertility evaluation best served through Reproductive Endocrinology/Infertilty. We reviewed potential impact of gonorrhea on increasing risk of scarring in fallopian tubes and increased risk of ectopic pregnancy. Start PNV.  AMH.  Pelvic US after her recheck in 2 weeks.    An After Visit Summary was printed and given to the patient.  ___45___ minutes face to face time of which over 50% was spent in counseling.

## 2016-10-09 NOTE — Progress Notes (Signed)
Patient ID: Tracy Rosales, female   DOB: Dec 20, 1975, 41 y.o.   MRN: [id number]  Prescription for Flagyl 500mg  BID x 7 days cancelled with Ebony Hail at [facility].

## 2016-10-12 LAB — ANTI MULLERIAN HORMONE: ANTI-MULLERIAN HORMONE (AMH): 1.49 ng/mL

## 2016-10-14 ENCOUNTER — Encounter: Payer: Self-pay | Admitting: Obstetrics and Gynecology

## 2016-10-16 ENCOUNTER — Telehealth: Payer: Self-pay | Admitting: Obstetrics and Gynecology

## 2016-10-16 DIAGNOSIS — D259 Leiomyoma of uterus, unspecified: Secondary | ICD-10-CM

## 2016-10-16 DIAGNOSIS — N920 Excessive and frequent menstruation with regular cycle: Secondary | ICD-10-CM

## 2016-10-16 NOTE — Telephone Encounter (Signed)
Call to patient. Left message to call back.  

## 2016-10-16 NOTE — Telephone Encounter (Signed)
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   I am recommending she follow up with me for a pelvic ultrasound to check her fibroids and recheck following treatment for PID.  She left the office without scheduling follow up.  I recommend a visit on August 23 if this works for the patient.  I have not placed an order yet for the ultrasound.

## 2016-10-16 NOTE — Telephone Encounter (Signed)
Patient returning your call.

## 2016-10-16 NOTE — Telephone Encounter (Signed)
Please contact patient in follow up to communications during the weekend to make sure that everyone is up to date. I sent a result note though yesterday with AntiMullerian Hormone level and asking to schedule a follow up appointment with me and a pelvic ultrasound for 11/26/16.  She left the office the day of her appointment without making a follow up appointment for PID. Dr. Sabra Heck also simultenaously had communication with my patient through My Chart regarding filling abx that I prescribed.  The patient has some financial challenges and was to respond back about the pharmacy she will choose to fill her prescriptions.     Please check back with the patient to assist her in her care and scheduling.

## 2016-10-16 NOTE — Telephone Encounter (Signed)
I strongly recommend taking the antibiotics.  Patient is being treated for PID related to her gonorrhea infection.  This can affect her fertility and places her at risk for significant pelvic infection which can even lead to hysterectomy if untreated.   She may wish to contact the health department to see if there are financial resources there to assist her in a sliding scale fashion.

## 2016-10-16 NOTE — Telephone Encounter (Signed)
I recommend completing evaluation for heavy menses prior to initiating treatment (birth control). I am still not sure about the cause of her heavy menses.  If she is not able to do the ultrasound through this office, she may wish to have this done at the [facility]

## 2016-10-16 NOTE — Telephone Encounter (Signed)
Patient returned call asking if Dr. Quincy Simmonds can start her on OCP or patch for heavy menses?   Advised patient of message as seen below per Dr. Quincy Simmonds. Patient states she is working on [facility] for prescriptions. Advised patient would update and review with Dr. Quincy Simmonds and return call with any additional recommendations. Patient is agreeable.   Dr. Quincy Simmonds -please review and advise on OCP?

## 2016-10-16 NOTE — Telephone Encounter (Signed)
Left message to call Teresa Lemmerman at [phone].  

## 2016-10-16 NOTE — Telephone Encounter (Signed)
Spoke with patient, advised of results and recommendations as seen below per Dr. Quincy Simmonds. Patient declined to schedule PUS at this time. Patient states she is unsure when she will start the medication d/t cost. Patient plans to check with [facility] as previously recommended by Dr. Sabra Heck. Patient states she will return call to office on Thursday with update on medication and scheduling. Patient aware to return call to office with any additional questions or for further assistance, verbalizes understanding.   Routing to Dr. Antony Blackbird, will keep encounter open and f/u with patient. Any additional recommendations?

## 2016-10-16 NOTE — Telephone Encounter (Signed)
Left message to call Giorgio Chabot at [phone].  

## 2016-10-17 MED ORDER — METRONIDAZOLE 500 MG PO TABS: 500.0000 mg | ORAL_TABLET | Freq: Two times a day (BID) | ORAL | 0 refills | Status: DC

## 2016-10-17 MED ORDER — DOXYCYCLINE HYCLATE 100 MG PO CAPS: 100.0000 mg | ORAL_CAPSULE | Freq: Two times a day (BID) | ORAL | 0 refills | Status: DC

## 2016-10-17 NOTE — Telephone Encounter (Signed)
Call to patient. Left message to call back to clinical supervisor, Gay Filler

## 2016-10-17 NOTE — Telephone Encounter (Signed)
Spoke with patient, advised as seen below per Dr. Quincy Simmonds. Patient expressed frustration, states her cycles effects her work d/t the pain and bleeding. RN expressed importance of recommended PUS for evaluation of cause of heavy menses. Provided options and assistance with scheduling PUS, patient did not accept or decline.  Patient states she is aware she has fibroids and this is cause of bleeding,  PUS showed fibroid in 2012. Patient again expressed need for OCP to control bleeding. RN asked patient if she can place on a brief hold to speak with clinical supervisor to provide any additional information, patient agreeable. Returned to call and patient had disconnected call.   Routing to Dr. Quincy Simmonds and Lamont Snowball, RN FYI.

## 2016-10-17 NOTE — Telephone Encounter (Signed)
Return call from patient.  Advised calling to review request for contraception. Clarified that previously patient discussed desire for pregnancy and referral to RE. Patient states she no longer desires pregnancy after learning all it would require.  Advised that prior to initiating contraception, must be treated for PID and have PUS for evaluation. Patient states she has not been able to pick up medication due to cost. Advised of risk of pelvic infection, pelvic scaring with resulting effects on fertility.  Patient states she will get medication on Thursday and requests I change pharmacy to [facility] will need to proceed with ultrasound for evaluation of pelvic before initiation of OCP to treat cycles. Patient states she can do this in Thursday since she is off work. Advised will attempt to schedule appointment for Thursday and call her back.  Advised that it is Dr Elza Rafter recommendation that she be treated for PID ASAP and if develops pelvic pain or fever, should go directly to ED.  Patient agreeable.     Doxycycline and Flagyl resent to [facility]. [facility] canceled. Encounter closed.

## 2016-10-18 ENCOUNTER — Telehealth: Payer: Self-pay | Admitting: Obstetrics and Gynecology

## 2016-10-18 NOTE — Telephone Encounter (Signed)
Thank you for the update!

## 2016-10-18 NOTE — Telephone Encounter (Signed)
Call to patient to review scheduling for Pelvic Ultrasound at [facility].  Patient is scheduled 10-19-16 to arrive at 11:45am for a 12pm ultrasound.  She should arrive at the main entrance to [facility] which is located between the [facility] and the emergency department entrance to the hospital. Please check in with Radiology department.   Per scheduler arrive with a full bladder.  Patient may contact scheduling at [phone] to cancel or reschedule her ultrasound.  Left voicemail for patient to return call for appointment details.  Routing to Provider for review. Cc: Lamont Snowball, RN

## 2016-10-18 NOTE — Telephone Encounter (Signed)
Patient returned call. Reviewed all information with patient. Patient verbalized understanding.   Routing to provider for review. Will close encounter.

## 2016-10-19 ENCOUNTER — Ambulatory Visit (HOSPITAL_COMMUNITY): Admission: RE | Admit: 2016-10-19 | Payer: BLUE CROSS/BLUE SHIELD | Source: Ambulatory Visit

## 2016-10-20 ENCOUNTER — Telehealth: Payer: Self-pay | Admitting: *Deleted

## 2016-10-20 NOTE — Telephone Encounter (Signed)
Patient did not show for pelvic ultrasound scheduled for yesterday 10-19-16 at [facility]. Please advise on follow-up.

## 2016-10-25 NOTE — Telephone Encounter (Signed)
Patient choice on rescheduling the pelvic ultrasound and follow up.  She has been adequately advised regarding both.  This is the second time she has no showed for a pelvic ultrasound, one ordered by the [facility] teaching service and one ordered by me.  I think it is up to her to call and take the next step in her care.

## 2016-10-26 ENCOUNTER — Ambulatory Visit (HOSPITAL_COMMUNITY): Payer: BLUE CROSS/BLUE SHIELD

## 2016-10-30 NOTE — Telephone Encounter (Signed)
Per [facility] appointment desk, ultrasound appointment has been rescheduled to 11-02-16.   Encounter closed.

## 2016-11-02 ENCOUNTER — Ambulatory Visit (HOSPITAL_COMMUNITY): Payer: BLUE CROSS/BLUE SHIELD | Attending: Obstetrics and Gynecology

## 2017-01-08 ENCOUNTER — Emergency Department (HOSPITAL_COMMUNITY)
Admission: EM | Admit: 2017-01-08 | Discharge: 2017-01-08 | Disposition: A | Discharge status: Home / Self Care | Payer: BLUE CROSS/BLUE SHIELD | Attending: Emergency Medicine | Admitting: Emergency Medicine

## 2017-01-08 ENCOUNTER — Encounter (HOSPITAL_COMMUNITY): Payer: Self-pay

## 2017-01-08 DIAGNOSIS — Z5321 Procedure and treatment not carried out due to patient leaving prior to being seen by health care provider: Secondary | ICD-10-CM | POA: Diagnosis not present

## 2017-01-08 DIAGNOSIS — M79645 Pain in left finger(s): Secondary | ICD-10-CM | POA: Diagnosis not present

## 2017-01-08 DIAGNOSIS — H9202 Otalgia, left ear: Secondary | ICD-10-CM | POA: Diagnosis present

## 2017-01-08 NOTE — ED Notes (Signed)
No answer in lobby x 2.

## 2017-01-08 NOTE — ED Notes (Signed)
Patient is here with a friend who is also here to be seen.  Patient insists that her friend be seen in same room.  Explained to patient that we can only treat one patient per room and her friend will be called when his room is available.  Patient appears agitated and decided to remain in waiting room with friend and refused to be brought back to her room for treatment.

## 2017-01-08 NOTE — ED Notes (Signed)
Patient states she does not want to be seen now.

## 2017-01-08 NOTE — ED Notes (Signed)
Attempt to place Pt in a room x 2. Pt refuses to come back without S/O who is also being seen.  Pt was explained that it is a one Pt per room policy and due to the acuity of the Pt's they are not going to be in the same area of the ED.  Pt S/O has now been placed in a room and Tracy Rosales is demanding to go back with Pt.  Pt is A&Ox4 and states she no longer wants to be seen by a provider and would is going back with s/o.  Pt informed she will be taken out of system and will have to check back in if she wishes to be seen. Pt says she is ok with this.

## 2017-01-08 NOTE — ED Triage Notes (Signed)
Pt arrived via POV c/o left ear pain for a couple weeks and left 3rd and 4th finger pain since Saturday.

## 2017-01-17 ENCOUNTER — Other Ambulatory Visit: Payer: Self-pay

## 2017-01-17 ENCOUNTER — Emergency Department (HOSPITAL_COMMUNITY)
Admission: EM | Admit: 2017-01-17 | Discharge: 2017-01-17 | Disposition: A | Discharge status: Home / Self Care | Payer: BLUE CROSS/BLUE SHIELD | Attending: Emergency Medicine | Admitting: Emergency Medicine

## 2017-01-17 ENCOUNTER — Emergency Department (HOSPITAL_COMMUNITY): Payer: BLUE CROSS/BLUE SHIELD

## 2017-01-17 ENCOUNTER — Encounter (HOSPITAL_COMMUNITY): Payer: Self-pay | Admitting: Emergency Medicine

## 2017-01-17 DIAGNOSIS — Y929 Unspecified place or not applicable: Secondary | ICD-10-CM | POA: Insufficient documentation

## 2017-01-17 DIAGNOSIS — S161XXA Strain of muscle, fascia and tendon at neck level, initial encounter: Secondary | ICD-10-CM

## 2017-01-17 DIAGNOSIS — R51 Headache: Secondary | ICD-10-CM | POA: Diagnosis not present

## 2017-01-17 DIAGNOSIS — S199XXA Unspecified injury of neck, initial encounter: Secondary | ICD-10-CM | POA: Diagnosis present

## 2017-01-17 DIAGNOSIS — Y939 Activity, unspecified: Secondary | ICD-10-CM | POA: Diagnosis not present

## 2017-01-17 DIAGNOSIS — Y998 Other external cause status: Secondary | ICD-10-CM | POA: Insufficient documentation

## 2017-01-17 DIAGNOSIS — J45909 Unspecified asthma, uncomplicated: Secondary | ICD-10-CM | POA: Insufficient documentation

## 2017-01-17 MED ORDER — CYCLOBENZAPRINE HCL 10 MG PO TABS: 10.0000 mg | ORAL_TABLET | Freq: Two times a day (BID) | ORAL | 0 refills | Status: DC | PRN

## 2017-01-17 MED ORDER — OXYCODONE-ACETAMINOPHEN 5-325 MG PO TABS
1.0000 | ORAL_TABLET | Freq: Once | ORAL | Status: AC
  Administered 2017-01-17: 1 via ORAL
  Filled 2017-01-17: qty 1
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
## 2017-01-17 NOTE — ED Triage Notes (Signed)
Pt states she has left posterior neck pain and a headache. Hx of headaches. Pt states she turned her head and felt her neck "pop" and a warm sensation. Neuro intact.
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
## 2017-01-17 NOTE — ED Notes (Signed)
ED Provider at bedside. 
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
## 2017-01-17 NOTE — ED Notes (Signed)
Pt departed in NAD. Refused to wait after medication administration. Refused wheelchair.

## 2017-01-17 NOTE — Discharge Instructions (Signed)
As we discussed, this could be muscular strain.  You can take Tylenol or Ibuprofen as directed for pain. You can alternate Tylenol and Ibuprofen every 4 hours. If you take Tylenol at 1pm, then you can take Ibuprofen at 5pm. Then you can take Tylenol again at 9pm.   Take Flexeril as prescribed. This medication will make you drowsy so do not drive or drink alcohol when taking it.  [facility] with [facility] to establish primary care.  Return to the emergency department for any worsening pain, difficulty walking, numbness/weakness of the arms or legs, urinary or bowel incontinence, difficulty breathing, difficulty swallowing, redness or swelling of the neck, fevers or any other worsening or concerning symptoms.

## 2017-03-23 ENCOUNTER — Encounter: Payer: Self-pay | Admitting: Obstetrics and Gynecology

## 2017-05-23 ENCOUNTER — Telehealth: Payer: Self-pay | Admitting: Family Medicine

## 2017-05-24 NOTE — Telephone Encounter (Signed)
error 

## 2017-05-29 NOTE — Progress Notes (Signed)
Subjective:    Patient ID: Tracy Rosales, female    DOB: 07-29-1975, 42 y.o.   MRN: [id number]  HPI Chief Complaint  Patient presents with  . new pt    new pt fasting cpe-wants to be put on depo shot. does not want pills. positive for Pevic infection and gonarrhea last year but never took the meds for these. having stomach pains, bleeding inbetween periods. bad odor, having unprotected sex off and on, had pain after sex last night- alittle pain with dryness   She is new to the practice and here for a complete physical exam. Moved here from [place] one year ago.   Previous medical care: in [place]: couple of years ago.   Other providers: none   Complaints of fatigue for the past couple of years.  She is concerned that she may have diabetes.   States she had gonorrhea and PID and "never got cleared".   History of anemia related to heavy menses, fibroids. She also has a history of endometriosis. Abnormal pap smears in the past. She would like to be referred to a new OB/GYN.  She also has pain with intercourse.   States she was diagnosed with asthma in 2011. Does not have albuterol inhaler at home. Denies being on preventive medication in the past.  States she thinks she had bronchitis in the past as well.  States in 2015 she was told she has allergies to chemicals.   Family history: diabetes in mother   Social history: Lives with her boyfriend. Has 2 kids. works in housekeeping at a hotel Diet: drinks a lot of soda, unhealthy diet  Excerise: walking   Immunizations: Tdap >10 years and declines this  Health maintenance:  Mammogram: 2017 and normal per patient.  Colonoscopy: N/A Last Gynecological Exam: pap smear abnormal in 2017 in [place] per patient  Last Menstrual cycle: 05/22/2017   Wears seatbelt always,  smoke detectors in home and functioning, does not text while driving and feels safe in home environment.   Reviewed allergies, medications, past  medical, surgical, family, and social history.   Review of Systems Review of Systems Constitutional: -fever, -chills, -sweats, -unexpected weight change,+fatigue ENT: -runny nose, -ear pain, -sore throat Cardiology:  -chest pain, -palpitations, -edema Respiratory: -cough, -shortness of breath, -wheezing Gastroenterology: -abdominal pain, -nausea, -vomiting, -diarrhea, -constipation  Hematology: -bleeding or bruising problems Musculoskeletal: -arthralgias, -myalgias, -joint swelling, -back pain Ophthalmology: -vision changes Urology: -dysuria, -difficulty urinating, -hematuria, -urinary frequency, -urgency Neurology: -headache, -weakness, -tingling, -numbness       Objective:   Physical Exam BP 130/80   Pulse 68   Ht 5' 4.75" (1.645 m)   Wt 229 lb (103.9 kg)   LMP 05/22/2017   SpO2 98%   BMI 38.40 kg/m   General Appearance:    Alert, cooperative, no distress, appears stated age  Head:    Normocephalic, without obvious abnormality, atraumatic  Eyes:    PERRL, conjunctiva/corneas clear, EOM's intact, fundi    benign  Ears:    Normal TM's and external ear canals  Nose:   Nares normal, mucosa normal, no drainage or sinus   tenderness  Throat:   Lips, mucosa, and tongue normal; teeth and gums normal  Neck:   Supple, no lymphadenopathy;  thyroid:  no   enlargement/tenderness/nodules; no carotid   bruit or JVD  Back:    Spine nontender, no curvature, ROM normal, no CVA     tenderness  Lungs:     Clear to  auscultation bilaterally without wheezes, rales or     ronchi; respirations unlabored  Chest Wall:    No tenderness or deformity   Heart:    Regular rate and rhythm, S1 and S2 normal, no murmur, rub   or gallop  Breast Exam:    OB/GYN. Mammogram ordered  Abdomen:     Soft, non-tender, nondistended, normoactive bowel sounds,    no masses, no hepatosplenomegaly  Genitalia:    Normal external genitalia without lesions.  vagina with white abnormal discharge. Unable to visualize  cervix due to body habitus and fibroid. Pap was not performed     Extremities:   No clubbing, cyanosis or edema  Pulses:   2+ and symmetric all extremities  Skin:   Skin color, texture, turgor normal, no rashes or lesions  Lymph nodes:   Cervical, supraclavicular, and axillary nodes normal  Neurologic:   CNII-XII intact, normal strength, sensation and gait; reflexes 2+ and symmetric throughout          Psych:   Normal mood, affect, hygiene and grooming.    Urinalysis dipstick: negative       Assessment & Plan:  Routine general medical examination at a health care facility - Plan: CBC with Differential/Platelet, Comprehensive metabolic panel, Lipid panel, TSH, POCT Urinalysis DIP (Proadvantage Device)  Family history of diabetes mellitus in mother - Plan: HgB A1c  Obesity (BMI 30-39.9) - Plan: HgB A1c, CBC with Differential/Platelet, Comprehensive metabolic panel, Lipid panel, TSH  History of PID  Endometriosis - Plan: Ambulatory referral to Obstetrics / Gynecology  Fibroids - Plan: Ambulatory referral to Obstetrics / Gynecology  Female dyspareunia - Plan: Ambulatory referral to Obstetrics / Gynecology, POCT Wet Prep [facility])  Screening for breast cancer - Plan: MM DIGITAL SCREENING BILATERAL  History of gonorrhea - Plan: RPR, GC/Chlamydia Probe Amp, HIV antibody, Ambulatory referral to Obstetrics / Gynecology  Screen for STD (sexually transmitted disease) - Plan: RPR, GC/Chlamydia Probe Amp, HIV antibody, POCT Wet Prep ([place] [place])  BV (bacterial vaginosis) - Plan: POCT Wet Prep ([place] [place]), metroNIDAZOLE (FLAGYL) 500 MG tablet  Mild intermittent asthma, unspecified whether complicated - Plan: albuterol (PROVENTIL HFA;VENTOLIN HFA) 108 (90 Base) MCG/ACT inhaler  Discussed that she does not have diabetes.  Hgb A1c 5.6%  Counseling done on risks of obesity. Discussed making healthy dietary changes and increasing physical activity.  History of asthma without recent flares.  Will prescribe albuterol inhaler and follow up on this and other chronic health conditions in 4 weeks.  Screen for STD due to history of STDs and PID.  She declines Tdap and understands that this is overdue.  Mammogram ordered and she will call to schedule this.  Referral to OB/GYN for history of endometriosis, fibroids and abnormal pap smears.  She will need a pap smear as I was unable to visualize her cervix today.  Wet prep - + BV, no trich or yeast. Will treat with metronidazole. Advised to avoid alcohol with this medication.  Follow up pending labs or in 4 weeks.

## 2017-05-30 ENCOUNTER — Encounter: Payer: Self-pay | Admitting: Family Medicine

## 2017-05-30 ENCOUNTER — Ambulatory Visit (INDEPENDENT_AMBULATORY_CARE_PROVIDER_SITE_OTHER): Payer: BLUE CROSS/BLUE SHIELD | Admitting: Family Medicine

## 2017-05-30 VITALS — BP 130/80 | HR 68 | Ht 64.75 in | Wt 229.0 lb

## 2017-05-30 DIAGNOSIS — N760 Acute vaginitis: Secondary | ICD-10-CM | POA: Diagnosis not present

## 2017-05-30 DIAGNOSIS — Z833 Family history of diabetes mellitus: Secondary | ICD-10-CM

## 2017-05-30 DIAGNOSIS — E669 Obesity, unspecified: Secondary | ICD-10-CM | POA: Insufficient documentation

## 2017-05-30 DIAGNOSIS — Z8742 Personal history of other diseases of the female genital tract: Secondary | ICD-10-CM | POA: Diagnosis not present

## 2017-05-30 DIAGNOSIS — Z8619 Personal history of other infectious and parasitic diseases: Secondary | ICD-10-CM

## 2017-05-30 DIAGNOSIS — Z1239 Encounter for other screening for malignant neoplasm of breast: Secondary | ICD-10-CM

## 2017-05-30 DIAGNOSIS — Z113 Encounter for screening for infections with a predominantly sexual mode of transmission: Secondary | ICD-10-CM

## 2017-05-30 DIAGNOSIS — D219 Benign neoplasm of connective and other soft tissue, unspecified: Secondary | ICD-10-CM

## 2017-05-30 DIAGNOSIS — N941 Unspecified dyspareunia: Secondary | ICD-10-CM

## 2017-05-30 DIAGNOSIS — Z0000 Encounter for general adult medical examination without abnormal findings: Secondary | ICD-10-CM

## 2017-05-30 DIAGNOSIS — B9689 Other specified bacterial agents as the cause of diseases classified elsewhere: Secondary | ICD-10-CM

## 2017-05-30 DIAGNOSIS — J4520 Mild intermittent asthma, uncomplicated: Secondary | ICD-10-CM

## 2017-05-30 DIAGNOSIS — N809 Endometriosis, unspecified: Secondary | ICD-10-CM

## 2017-05-30 DIAGNOSIS — Z1231 Encounter for screening mammogram for malignant neoplasm of breast: Secondary | ICD-10-CM

## 2017-05-30 LAB — POCT URINALYSIS DIP (PROADVANTAGE DEVICE)
Bilirubin, UA: NEGATIVE
Blood, UA: NEGATIVE
Glucose, UA: NEGATIVE mg/dL
Ketones, POC UA: NEGATIVE mg/dL
Leukocytes, UA: NEGATIVE
Nitrite, UA: NEGATIVE
Protein Ur, POC: NEGATIVE mg/dL
Specific Gravity, Urine: 1.03
Urobilinogen, Ur: NEGATIVE
pH, UA: 6 (ref 5.0–8.0)

## 2017-05-30 LAB — POCT WET PREP (WET MOUNT)
Clue Cells Wet Prep Whiff POC: POSITIVE
KOH Wet Prep POC: NEGATIVE
Trichomonas Wet Prep HPF POC: ABSENT

## 2017-05-30 LAB — POCT GLYCOSYLATED HEMOGLOBIN (HGB A1C): Hemoglobin A1C: 5.6

## 2017-05-30 MED ORDER — ALBUTEROL SULFATE HFA 108 (90 BASE) MCG/ACT IN AERS: 2.0000 | INHALATION_SPRAY | Freq: Four times a day (QID) | RESPIRATORY_TRACT | 0 refills | Status: DC | PRN

## 2017-05-30 MED ORDER — METRONIDAZOLE 500 MG PO TABS: 500.0000 mg | ORAL_TABLET | Freq: Two times a day (BID) | ORAL | 0 refills | Status: DC

## 2017-05-30 NOTE — Patient Instructions (Addendum)
Take the metronidazole for bacterial vaginosis. Do not drink alcohol with this medication, it will make you sick.   Call and schedule your mammogram.   Cut back on sodas and sugary foods.   You will receive a call from [facility] which is an OB/GYN office to schedule an appointment.   We will call you with your lab results.   Follow up in 4 weeks for asthma. I will send in an albuterol inhaler in case you need it.    Preventative Care for Adults - Female      MAINTAIN REGULAR HEALTH EXAMS:  A routine yearly physical is a good way to check in with your primary care provider about your health and preventive screening. It is also an opportunity to share updates about your health and any concerns you have, and receive a thorough all-over exam.   Most health insurance companies pay for at least some preventative services.  Check with your health plan for specific coverages.  WHAT PREVENTATIVE SERVICES DO WOMEN NEED?  Adult women should have their weight and blood pressure checked regularly.   Women age 54 and older should have their cholesterol levels checked regularly.  Women should be screened for cervical cancer with a Pap smear and pelvic exam beginning at either age 70, or 3 years after they become sexually activity.    Breast cancer screening generally begins at age 38 with a mammogram and breast exam by your primary care provider.    Beginning at age 73 and continuing to age 66, women should be screened for colorectal cancer.  Certain people may need continued testing until age 8.  Updating vaccinations is part of preventative care.  Vaccinations help protect against diseases such as the flu.  Osteoporosis is a disease in which the bones lose minerals and strength as we age. Women ages 51 and over should discuss this with their caregivers, as should women after menopause who have other risk factors.  Lab tests are generally done as part of preventative care to screen  for anemia and blood disorders, to screen for problems with the kidneys and liver, to screen for bladder problems, to check blood sugar, and to check your cholesterol level.  Preventative services generally include counseling about diet, exercise, avoiding tobacco, drugs, excessive alcohol consumption, and sexually transmitted infections.    GENERAL RECOMMENDATIONS FOR GOOD HEALTH:  Healthy diet:  Eat a variety of foods, including fruit, vegetables, animal or vegetable protein, such as meat, fish, chicken, and eggs, or beans, lentils, tofu, and grains, such as rice.  Drink plenty of water daily.  Decrease saturated fat in the diet, avoid lots of red meat, processed foods, sweets, fast foods, and fried foods.  Exercise:  Aerobic exercise helps maintain good heart health. At least 30-40 minutes of moderate-intensity exercise is recommended. For example, a brisk walk that increases your heart rate and breathing. This should be done on most days of the week.   Find a type of exercise or a variety of exercises that you enjoy so that it becomes a part of your daily life.  Examples are running, walking, swimming, water aerobics, and biking.  For motivation and support, explore group exercise such as aerobic class, spin class, Zumba, Yoga,or  martial arts, etc.    Set exercise goals for yourself, such as a certain weight goal, walk or run in a race such as a 5k walk/run.  Speak to your primary care provider about exercise goals.  Disease prevention:  If you smoke or chew tobacco, find out from your caregiver how to quit. It can literally save your life, no matter how long you have been a tobacco user. If you do not use tobacco, never begin.   Maintain a healthy diet and normal weight. Increased weight leads to problems with blood pressure and diabetes.   The Body Mass Index or BMI is a way of measuring how much of your body is fat. Having a BMI above 27 increases the risk of heart disease,  diabetes, hypertension, stroke and other problems related to obesity. Your caregiver can help determine your BMI and based on it develop an exercise and dietary program to help you achieve or maintain this important measurement at a healthful level.  High blood pressure causes heart and blood vessel problems.  Persistent high blood pressure should be treated with medicine if weight loss and exercise do not work.   Fat and cholesterol leaves deposits in your arteries that can block them. This causes heart disease and vessel disease elsewhere in your body.  If your cholesterol is found to be high, or if you have heart disease or certain other medical conditions, then you may need to have your cholesterol monitored frequently and be treated with medication.   Ask if you should have a cardiac stress test if your history suggests this. A stress test is a test done on a treadmill that looks for heart disease. This test can find disease prior to there being a problem.  Menopause can be associated with physical symptoms and risks. Hormone replacement therapy is available to decrease these. You should talk to your caregiver about whether starting or continuing to take hormones is right for you.   Osteoporosis is a disease in which the bones lose minerals and strength as we age. This can result in serious bone fractures. Risk of osteoporosis can be identified using a bone density scan. Women ages 60 and over should discuss this with their caregivers, as should women after menopause who have other risk factors. Ask your caregiver whether you should be taking a calcium supplement and Vitamin D, to reduce the rate of osteoporosis.   Avoid drinking alcohol in excess (more than two drinks per day).  Avoid use of street drugs. Do not share needles with anyone. Ask for professional help if you need assistance or instructions on stopping the use of alcohol, cigarettes, and/or drugs.  Brush your teeth twice a day with  fluoride toothpaste, and floss once a day. Good oral hygiene prevents tooth decay and gum disease. The problems can be painful, unattractive, and can cause other health problems. Visit your dentist for a routine oral and dental check up and preventive care every 6-12 months.   Look at your skin regularly.  Use a mirror to look at your back. Notify your caregivers of changes in moles, especially if there are changes in shapes, colors, a size larger than a pencil eraser, an irregular border, or development of new moles.  Safety:  Use seatbelts 100% of the time, whether driving or as a passenger.  Use safety devices such as hearing protection if you work in environments with loud noise or significant background noise.  Use safety glasses when doing any work that could send debris in to the eyes.  Use a helmet if you ride a bike or motorcycle.  Use appropriate safety gear for contact sports.  Talk to your caregiver about gun safety.  Use sunscreen with a SPF (or skin  protection factor) of 15 or greater.  Lighter skinned people are at a greater risk of skin cancer. Don't forget to also wear sunglasses in order to protect your eyes from too much damaging sunlight. Damaging sunlight can accelerate cataract formation.   Practice safe sex. Use condoms. Condoms are used for birth control and to help reduce the spread of sexually transmitted infections (or STIs).  Some of the STIs are gonorrhea (the clap), chlamydia, syphilis, trichomonas, herpes, HPV (human papilloma virus) and HIV (human immunodeficiency virus) which causes AIDS. The herpes, HIV and HPV are viral illnesses that have no cure. These can result in disability, cancer and death.   Keep carbon monoxide and smoke detectors in your home functioning at all times. Change the batteries every 6 months or use a model that plugs into the wall.   Vaccinations:  Stay up to date with your tetanus shots and other required immunizations. You should have a  booster for tetanus every 10 years. Be sure to get your flu shot every year, since 5%-20% of the [place] population comes down with the flu. The flu vaccine changes each year, so being vaccinated once is not enough. Get your shot in the fall, before the flu season peaks.   Other vaccines to consider:  Human Papilloma Virus or HPV causes cancer of the cervix, and other infections that can be transmitted from person to person. There is a vaccine for HPV, and females should get immunized between the ages of 4 and 62. It requires a series of 3 shots.   Pneumococcal vaccine to protect against certain types of pneumonia.  This is normally recommended for adults age 21 or older.  However, adults younger than 43 years old with certain underlying conditions such as diabetes, heart or lung disease should also receive the vaccine.  Shingles vaccine to protect against Varicella Zoster if you are older than age 94, or younger than 43 years old with certain underlying illness.  Hepatitis A vaccine to protect against a form of infection of the liver by a virus acquired from food.  Hepatitis B vaccine to protect against a form of infection of the liver by a virus acquired from blood or body fluids, particularly if you work in health care.  If you plan to travel internationally, check with your local health department for specific vaccination recommendations.  Cancer Screening:  Breast cancer screening is essential to preventive care for women. All women age 52 and older should perform a breast self-exam every month. At age 3 and older, women should have their caregiver complete a breast exam each year. Women at ages 40 and older should have a mammogram (x-ray film) of the breasts. Your caregiver can discuss how often you need mammograms.    Cervical cancer screening includes taking a Pap smear (sample of cells examined under a microscope) from the cervix (end of the uterus). It also includes testing for HPV (Human  Papilloma Virus, which can cause cervical cancer). Screening and a pelvic exam should begin at age 61, or 3 years after a woman becomes sexually active. Screening should occur every year, with a Pap smear but no HPV testing, up to age 64. After age 42, you should have a Pap smear every 3 years with HPV testing, if no HPV was found previously.   Most routine colon cancer screening begins at the age of 26. On a yearly basis, doctors may provide special easy to use take-home tests to check for hidden blood in the  stool. Sigmoidoscopy or colonoscopy can detect the earliest forms of colon cancer and is life saving. These tests use a small camera at the end of a tube to directly examine the colon. Speak to your caregiver about this at age 40, when routine screening begins (and is repeated every 5 years unless early forms of pre-cancerous polyps or small growths are found).

## 2017-05-31 ENCOUNTER — Telehealth: Payer: Self-pay | Admitting: Family Medicine

## 2017-05-31 LAB — TSH: TSH: 1.65 u[IU]/mL (ref 0.450–4.500)

## 2017-05-31 LAB — COMPREHENSIVE METABOLIC PANEL
ALT: 21 IU/L (ref 0–32)
AST: 23 IU/L (ref 0–40)
Albumin/Globulin Ratio: 1.8 (ref 1.2–2.2)
Albumin: 4.4 g/dL (ref 3.5–5.5)
Alkaline Phosphatase: 52 IU/L (ref 39–117)
BUN/Creatinine Ratio: 18 (ref 9–23)
BUN: 13 mg/dL (ref 6–24)
Bilirubin Total: 0.3 mg/dL (ref 0.0–1.2)
CO2: 20 mmol/L (ref 20–29)
Calcium: 9.1 mg/dL (ref 8.7–10.2)
Chloride: 105 mmol/L (ref 96–106)
Creatinine, Ser: 0.72 mg/dL (ref 0.57–1.00)
GFR calc Af Amer: 119 mL/min/{1.73_m2} (ref >59)
GFR calc non Af Amer: 104 mL/min/{1.73_m2} (ref >59)
Globulin, Total: 2.4 g/dL (ref 1.5–4.5)
Glucose: 92 mg/dL (ref 65–99)
Potassium: 4.7 mmol/L (ref 3.5–5.2)
Sodium: 141 mmol/L (ref 134–144)
Total Protein: 6.8 g/dL (ref 6.0–8.5)

## 2017-05-31 LAB — CBC WITH DIFFERENTIAL/PLATELET
Basophils Absolute: 0 10*3/uL (ref 0.0–0.2)
Basos: 0 %
EOS (ABSOLUTE): 0.1 10*3/uL (ref 0.0–0.4)
Eos: 2 %
Hematocrit: 33.9 % — ABNORMAL LOW (ref 34.0–46.6)
Hemoglobin: 10.3 g/dL — ABNORMAL LOW (ref 11.1–15.9)
Immature Grans (Abs): 0 10*3/uL (ref 0.0–0.1)
Immature Granulocytes: 0 %
Lymphocytes Absolute: 2.4 10*3/uL (ref 0.7–3.1)
Lymphs: 42 %
MCH: 26.3 pg — ABNORMAL LOW (ref 26.6–33.0)
MCHC: 30.4 g/dL — ABNORMAL LOW (ref 31.5–35.7)
MCV: 87 fL (ref 79–97)
Monocytes Absolute: 0.4 10*3/uL (ref 0.1–0.9)
Monocytes: 7 %
Neutrophils Absolute: 2.8 10*3/uL (ref 1.4–7.0)
Neutrophils: 49 %
Platelets: 472 10*3/uL — ABNORMAL HIGH (ref 150–379)
RBC: 3.91 x10E6/uL (ref 3.77–5.28)
RDW: 14.9 % (ref 12.3–15.4)
WBC: 5.7 10*3/uL (ref 3.4–10.8)

## 2017-05-31 LAB — LIPID PANEL
Chol/HDL Ratio: 3.5 ratio (ref 0.0–4.4)
Cholesterol, Total: 150 mg/dL (ref 100–199)
HDL: 43 mg/dL (ref >39)
LDL Calculated: 92 mg/dL (ref 0–99)
Triglycerides: 76 mg/dL (ref 0–149)
VLDL Cholesterol Cal: 15 mg/dL (ref 5–40)

## 2017-05-31 LAB — RPR: RPR Ser Ql: NONREACTIVE

## 2017-05-31 LAB — HIV ANTIBODY (ROUTINE TESTING W REFLEX): HIV Screen 4th Generation wRfx: NONREACTIVE

## 2017-05-31 NOTE — Telephone Encounter (Signed)
New Message  Pt verbalized she was waiting to hear back in regards of the depo and would like for someone to give her a call back.  Please f/u

## 2017-05-31 NOTE — Telephone Encounter (Signed)
Pt was notified that obgyn that we are referring her too can do this

## 2017-06-03 LAB — GC/CHLAMYDIA PROBE AMP
Chlamydia trachomatis, NAA: NEGATIVE
Neisseria gonorrhoeae by PCR: NEGATIVE

## 2017-06-06 ENCOUNTER — Encounter: Payer: Self-pay | Admitting: Internal Medicine

## 2017-06-27 ENCOUNTER — Ambulatory Visit: Payer: BLUE CROSS/BLUE SHIELD

## 2017-07-20 ENCOUNTER — Encounter: Payer: Self-pay | Admitting: Family Medicine

## 2017-07-20 NOTE — Telephone Encounter (Signed)
Please see my previous note. I discussed her case with Dr. Redmond School and we both agree that based on her history she is advised to seek care at her OB/GYN or the Health department since she may need additional testing.   Pt does not have insurance right now and only is wanting depo shot. Pt was advised to go to [facility] hospital or health department since she is in pain. She has been taking ibuprofen 800mg  3 times a day. No relief. She was referred back in April to [facility] and they tried to call her multiples times and I even sent a letter to her to call them to schedule.

## 2017-07-23 ENCOUNTER — Encounter: Payer: Self-pay | Admitting: Family Medicine

## 2017-10-05 ENCOUNTER — Ambulatory Visit: Payer: BLUE CROSS/BLUE SHIELD | Admitting: Podiatry

## 2017-11-19 ENCOUNTER — Inpatient Hospital Stay (HOSPITAL_COMMUNITY)
Admission: AD | Admit: 2017-11-19 | Discharge: 2017-11-19 | Disposition: A | Discharge status: Home / Self Care | Payer: Medicaid Other | Source: Ambulatory Visit | Attending: Obstetrics and Gynecology | Admitting: Obstetrics and Gynecology

## 2017-11-19 DIAGNOSIS — N921 Excessive and frequent menstruation with irregular cycle: Secondary | ICD-10-CM | POA: Diagnosis not present

## 2017-11-19 MED ORDER — PROGESTERONE 50 MG/ML IM OIL
10.0000 mg | TOPICAL_OIL | Freq: Every day | INTRAMUSCULAR | Status: DC
  Administered 2017-11-19: 10 mg via INTRAMUSCULAR
  Filled 2017-11-19 (×2): qty 0.2

## 2017-11-19 NOTE — MAU Note (Signed)
Pt sent in for dose of Progesterone IM.  Clarified with pt, she has not received previously.  Has had depo before for bleeding.  Was started on Provera to stop the bleeding and it isn't working.  So Dr Ulanda Edison ordered this.  Pt's HGB is 10.8.

## 2017-11-19 NOTE — Discharge Instructions (Signed)
Progesterone injection What is this medicine? PROGESTERONE (proe JES ter one) is a female hormone. It is used to treat missed menstrual periods or abnormal uterine bleeding caused by a hormone imbalance. This medicine may be used for other purposes; ask your health care provider or pharmacist if you have questions. What should I tell my health care provider before I take this medicine? They need to know if you have any of these conditions: -blood vessel disease, blood clotting disorder, or suffered a stroke -breast, cervical or vaginal cancer -diabetes -heart disease -liver disease -recent abortion, miscarriage -vaginal bleeding -an unusual or allergic reaction to progesterone, other hormones, medicines, foods, dyes, or preservatives -pregnant or trying to get pregnant -breast-feeding How should I use this medicine? This medicine is for injection into a muscle. It is usually given by a health care professional in a hospital or clinic setting. If you get this medicine at home, you will be taught how to prepare and give this medicine. Use exactly as directed. Take your medicine at regular intervals. Do not take your medicine more often than directed. It is important that you put your used needles and syringes in a special sharps container. Do not put them in a trash can. If you do not have a sharps container, call your pharmacist or healthcare provider to get one. Talk to your pediatrician regarding the use of this medicine in children. Special care may be needed. Overdosage: If you think you have taken too much of this medicine contact a poison control center or emergency room at once. NOTE: This medicine is only for you. Do not share this medicine with others. What if I miss a dose? If you miss a dose, take it as soon as you can. If it is almost time for your next dose, take only that dose. Do not take double or extra doses. What may interact with this medicine? Do not take this medicine  with any of the following medications: -bosentan This medicine may also interact with the following medications: -barbiturate medicines for sleep or seizures -bexarotene -carbamazepine -ethotoin -ketoconazole -phenytoin -rifampin This list may not describe all possible interactions. Give your health care provider a list of all the medicines, herbs, non-prescription drugs, or dietary supplements you use. Also tell them if you smoke, drink alcohol, or use illegal drugs. Some items may interact with your medicine. What should I watch for while using this medicine? Your condition will be monitored carefully while you are receiving this medicine. Tell your doctor or healthcare professional if your symptoms do not start to get better or if they get worse. What side effects may I notice from receiving this medicine? Side effects that you should report to your doctor or health care professional as soon as possible: -allergic reactions like skin rash, itching or hives, swelling of the face, lips, or tongue -breast tissue changes or discharge -breathing problems -changes in vaginal bleeding during your period or between your periods -changes in vision -depression -numbness or pain in the arm or leg -pain at site where injected -pain, swelling, warmth in the leg -problems with balance, talking, walking -sudden severe headache -unusually weak or tired -yellowing of the eyes or skin Side effects that usually do not require medical attention (report to your doctor or health care professional if they continue or are bothersome): -changes in emotions or moods -fluid retention and swelling -hair loss -increased in appetite -nausea -trouble sleeping This list may not describe all possible side effects. Call your doctor for  medical advice about side effects. You may report side effects to FDA at 1-800-FDA-1088. Where should I keep my medicine? Keep out of the reach of children. If you are using  this medicine at home, you will be instructed on how to store this medicine. Throw away any unused medicine after the expiration date on the label. NOTE: This sheet is a summary. It may not cover all possible information. If you have questions about this medicine, talk to your doctor, pharmacist, or health care provider.  2018 Elsevier/Gold Standard (2007-09-23 17:05:08)

## 2018-01-07 ENCOUNTER — Encounter: Payer: Medicaid Other | Admitting: Podiatry

## 2018-01-07 ENCOUNTER — Ambulatory Visit: Payer: Medicaid Other

## 2018-01-07 DIAGNOSIS — M7750 Other enthesopathy of unspecified foot: Secondary | ICD-10-CM

## 2018-01-07 NOTE — Progress Notes (Signed)
This encounter was created in error - please disregard.

## 2018-01-11 ENCOUNTER — Other Ambulatory Visit: Payer: Self-pay | Admitting: Obstetrics and Gynecology

## 2018-02-21 ENCOUNTER — Ambulatory Visit (HOSPITAL_COMMUNITY)
Admission: RE | Admit: 2018-02-21 | Payer: Medicaid Other | Source: Home / Self Care | Admitting: Obstetrics and Gynecology

## 2018-02-21 SURGERY — DILATATION AND CURETTAGE /HYSTEROSCOPY
Anesthesia: General

## 2018-03-11 ENCOUNTER — Telehealth: Payer: Self-pay | Admitting: Obstetrics and Gynecology

## 2018-03-11 NOTE — Telephone Encounter (Signed)
Patient was seeing Dr. Newton Pigg and was scheduled for [facility] on 12/18. Missed appointment and would like to return to our office. Patient previously saw Dr. Quincy Simmonds on 10/09/2016 for a new patient appointment, but would like to switch to Tracy Rosales. Unsure of what type of appointment is needed.

## 2018-03-11 NOTE — Telephone Encounter (Signed)
Routing to clinical supervisor for review.

## 2018-03-11 NOTE — Telephone Encounter (Signed)
Return call to patient. Female states patient is at work and will not be available till 430-5pm. Left message to call back and ask for Gay Filler.

## 2018-03-12 NOTE — Telephone Encounter (Signed)
Patient returned call

## 2018-03-12 NOTE — Telephone Encounter (Signed)
Return call to patient. States she had been seeing Dr Ulanda Edison but missed too many appointments and now has to find a new physician.  Advised she would need to see Dr Quincy Simmonds instead of NP.  Discussed that we had previously order pelvic ultrasound that she was not able to comply with and that we also would require active participation in her care and keep scheduled appointments.  States she haas a new job and does not have PTO time. She will check with HR and call back when she knows when she can take time off.

## 2018-03-21 NOTE — Telephone Encounter (Signed)
No further contact from patient.  Routing to Dr Quincy Simmonds. Encounter closed.

## 2018-04-05 ENCOUNTER — Emergency Department (HOSPITAL_COMMUNITY)
Admission: EM | Admit: 2018-04-05 | Discharge: 2018-04-05 | Disposition: A | Discharge status: Home / Self Care | Payer: Medicaid Other | Attending: Emergency Medicine | Admitting: Emergency Medicine

## 2018-04-05 ENCOUNTER — Other Ambulatory Visit: Payer: Self-pay

## 2018-04-05 ENCOUNTER — Emergency Department (HOSPITAL_COMMUNITY): Payer: Medicaid Other

## 2018-04-05 ENCOUNTER — Encounter (HOSPITAL_COMMUNITY): Payer: Self-pay | Admitting: Emergency Medicine

## 2018-04-05 DIAGNOSIS — R0789 Other chest pain: Secondary | ICD-10-CM | POA: Diagnosis present

## 2018-04-05 DIAGNOSIS — J45909 Unspecified asthma, uncomplicated: Secondary | ICD-10-CM | POA: Insufficient documentation

## 2018-04-05 LAB — BASIC METABOLIC PANEL
Anion gap: 8 (ref 5–15)
BUN: 12 mg/dL (ref 6–20)
CO2: 23 mmol/L (ref 22–32)
Calcium: 9 mg/dL (ref 8.9–10.3)
Chloride: 108 mmol/L (ref 98–111)
Creatinine, Ser: 0.68 mg/dL (ref 0.44–1.00)
GFR calc Af Amer: >60 mL/min (ref >60)
GFR calc non Af Amer: >60 mL/min (ref >60)
Glucose, Bld: 113 mg/dL — ABNORMAL HIGH (ref 70–99)
Potassium: 3.8 mmol/L (ref 3.5–5.1)
Sodium: 139 mmol/L (ref 135–145)

## 2018-04-05 LAB — I-STAT TROPONIN, ED
Troponin i, poc: 0 ng/mL (ref 0.00–0.08)
Troponin i, poc: 0 ng/mL (ref 0.00–0.08)

## 2018-04-05 LAB — CBC
HCT: 36.9 % (ref 36.0–46.0)
Hemoglobin: 11.6 g/dL — ABNORMAL LOW (ref 12.0–15.0)
MCH: 28.4 pg (ref 26.0–34.0)
MCHC: 31.4 g/dL (ref 30.0–36.0)
MCV: 90.2 fL (ref 80.0–100.0)
Platelets: 398 10*3/uL (ref 150–400)
RBC: 4.09 MIL/uL (ref 3.87–5.11)
RDW: 13.2 % (ref 11.5–15.5)
WBC: 6.6 10*3/uL (ref 4.0–10.5)
nRBC: 0 % (ref 0.0–0.2)

## 2018-04-05 LAB — I-STAT BETA HCG BLOOD, ED (MC, WL, AP ONLY): I-stat hCG, quantitative: <5 m[IU]/mL (ref <5)

## 2018-04-05 MED ORDER — MORPHINE SULFATE (PF) 4 MG/ML IV SOLN
4.0000 mg | Freq: Once | INTRAVENOUS | Status: AC
  Administered 2018-04-05: 4 mg via INTRAVENOUS
  Filled 2018-04-05: qty 1

## 2018-04-05 MED ORDER — ALUM & MAG HYDROXIDE-SIMETH 200-200-20 MG/5ML PO SUSP
30.0000 mL | Freq: Once | ORAL | Status: AC
  Administered 2018-04-05: 30 mL via ORAL
  Filled 2018-04-05: qty 30

## 2018-04-05 MED ORDER — SODIUM CHLORIDE 0.9% FLUSH
3.0000 mL | Freq: Once | INTRAVENOUS | Status: AC
  Administered 2018-04-05: 3 mL via INTRAVENOUS

## 2018-04-05 NOTE — ED Notes (Signed)
Patient verbalizes understanding of discharge instructions. Opportunity for questioning and answers were provided. Armband removed by staff, pt discharged from ED ambulatory.   

## 2018-04-05 NOTE — ED Triage Notes (Signed)
Pt to ED with c/o chest tightness.  St's also had same episode 2 weeks ago.  Pt st's she did feel slightly short of breath.  Pt denies nausea or vomiting

## 2018-04-05 NOTE — ED Provider Notes (Signed)
[facility] Provider Note   CSN: [id number] Arrival date & time: 04/05/18  1656     History   Chief Complaint Chief Complaint  Patient presents with  . Chest Pain    HPI Alise Calais is a 43 y.o. female.  The history is provided by the patient and medical records. No language interpreter was used.  Chest Pain     43 year old female with history of asthma, bronchitis, obesity presenting for evaluation of chest pain.  Patient report for the past 4 hours she has had pain primarily to the right side of her chest.  She described pain as a sharp stabbing sensation, radiates to her right arm which is been persistent.  She also endorsed some associated shortness of breath with this pain.  Pain is moderate in severity.  Nothing seems to make it better or worse.  Pain has been improving while she is here in the ED.  No associated fever or chills, lightheadedness, dizziness, exertional chest pain, nausea, vomiting, diaphoresis, productive cough, hemoptysis, back pain or abdominal pain.  She did recall having one similar episode 2 weeks ago lasting for several hours and resolved.  She does admits to heavy lifting on a regular basis but nothing new.  She denies any prior history of cardiac disease she is not a smoker heavy drinker.  She denies any postprandial pain.  She denies any specific treatment tried today.  Past Medical History:  Diagnosis Date  . Abnormal uterine bleeding   . Asthma   . Bronchitis   . Depression   . Endometriosis   . Headache(784.0)   . Obesity   . Uterine fibroid   . Vaginal Pap smear, abnormal    2017    Patient Active Problem List   Diagnosis Date Noted  . Obesity (BMI 30-39.9) 05/30/2017  . Family history of diabetes mellitus in mother 05/30/2017  . History of PID 05/30/2017  . Endometriosis 05/30/2017  . Fibroids 05/30/2017  . Female dyspareunia 05/30/2017  . Mild intermittent asthma 05/30/2017  . BV (bacterial  vaginosis) 05/30/2017  . Pelvic pain in female 10/13/2011    Past Surgical History:  Procedure Laterality Date  . CESAREAN SECTION    . DILATION AND CURETTAGE OF UTERUS     for heavy periods      OB History    Gravida  2   Para  2   Term  2   Preterm      AB      Living  2     SAB      TAB      Ectopic      Multiple      Live Births               Home Medications    Prior to Admission medications   Medication Sig Start Date End Date Taking? Authorizing Provider  albuterol (PROVENTIL HFA;VENTOLIN HFA) 108 (90 Base) MCG/ACT inhaler Inhale 2 puffs into the lungs every 6 (six) hours as needed for wheezing or shortness of breath. Patient not taking: Reported on 04/05/2018 05/30/17   Harland Dingwall L, NP-C  metroNIDAZOLE (FLAGYL) 500 MG tablet Take 1 tablet (500 mg total) by mouth 2 (two) times daily. Patient not taking: Reported on 04/05/2018 05/30/17   Girtha Rm, NP-C    Family History Family History  Problem Relation Age of Onset  . Diabetes Mother   . Hypertension Mother   . Thyroid disease Maternal Aunt   .  Uterine cancer Cousin   . Migraines Son     Social History Social History   Tobacco Use  . Smoking status: Never Smoker  . Smokeless tobacco: Never Used  Substance Use Topics  . Alcohol use: Yes    Alcohol/week: 2.0 standard drinks    Types: 1 Glasses of wine, 1 Cans of beer per week    Comment: 1-6 beers daily   . Drug use: No     Allergies   Codeine   Review of Systems Review of Systems  Cardiovascular: Positive for chest pain.  All other systems reviewed and are negative.    Physical Exam Updated Vital Signs BP (!) 134/98 (BP Location: Right Arm)   Pulse 86   Temp 98.9 F (37.2 C) (Oral)   Resp 18   Ht 5\' 4"  (1.626 m)   Wt 102.1 kg   LMP 04/03/2018 (Approximate)   SpO2 97%   BMI 38.62 kg/m   Physical Exam Vitals signs and nursing note reviewed.  Constitutional:      General: She is not in acute distress.     Appearance: She is well-developed. She is obese.  HENT:     Head: Atraumatic.  Eyes:     Conjunctiva/sclera: Conjunctivae normal.  Neck:     Musculoskeletal: Neck supple.  Cardiovascular:     Rate and Rhythm: Normal rate and regular rhythm.     Heart sounds: Normal heart sounds.  Pulmonary:     Effort: No respiratory distress.     Breath sounds: Normal breath sounds.  Chest:     Chest wall: No tenderness.  Abdominal:     Palpations: Abdomen is soft.     Tenderness: There is no abdominal tenderness.  Musculoskeletal:     Right lower leg: No edema.     Left lower leg: No edema.  Skin:    Findings: No rash.  Neurological:     Mental Status: She is alert and oriented to person, place, and time.      ED Treatments / Results  Labs (all labs ordered are listed, but only abnormal results are displayed) Labs Reviewed  BASIC METABOLIC PANEL - Abnormal; Notable for the following components:      Result Value   Glucose, Bld 113 (*)    All other components within normal limits  CBC - Abnormal; Notable for the following components:   Hemoglobin 11.6 (*)    All other components within normal limits  I-STAT TROPONIN, ED  I-STAT BETA HCG BLOOD, ED ([facility], [facility], [facility] ONLY)  I-STAT TROPONIN, ED    EKG None  ED ECG REPORT   Date: 04/05/2018  Rate: 80  Rhythm: normal sinus rhythm  QRS Axis: normal  Intervals: normal  ST/T Wave abnormalities: nonspecific T wave changes  Conduction Disutrbances:none  Narrative Interpretation:   Old EKG Reviewed: unchanged  I have personally reviewed the EKG tracing and agree with the computerized printout as noted.   Radiology Dg Chest 2 View  Result Date: 04/05/2018 CLINICAL DATA:  Pt sates Right side chest pain AND SOB x 2 week. No other symptom. Hx of asthma, and bronchitis. No hx of surgeries. EXAM: CHEST - 2 VIEW COMPARISON:  None. FINDINGS: Cardiac silhouette is normal in size. No mediastinal or hilar masses. No evidence of adenopathy.  Clear lungs.  No pleural effusion or pneumothorax. Mild elevation of left hemidiaphragm, stable. Skeletal structures are unremarkable IMPRESSION: No active cardiopulmonary disease. Electronically Signed   By: Lajean Manes M.D.   On:  04/05/2018 18:38    Procedures Procedures (including critical care time)  Medications Ordered in ED Medications  sodium chloride flush (NS) 0.9 % injection 3 mL (3 mLs Intravenous Given 04/05/18 1924)  morphine 4 MG/ML injection 4 mg (4 mg Intravenous Given 04/05/18 1924)  alum & mag hydroxide-simeth (MAALOX/MYLANTA) 200-200-20 MG/5ML suspension 30 mL (30 mLs Oral Given 04/05/18 1924)     Initial Impression / Assessment and Plan / ED Course  I have reviewed the triage vital signs and the nursing notes.  Pertinent labs & imaging results that were available during my care of the patient were reviewed by me and considered in my medical decision making (see chart for details).     BP 107/71 (BP Location: Left Arm)   Pulse 70   Temp 98.6 F (37 C) (Oral)   Resp 16   Ht 5\' 4"  (1.626 m)   Wt 102.1 kg   LMP 04/03/2018 (Approximate)   SpO2 98%   BMI 38.62 kg/m    Final Clinical Impressions(s) / ED Diagnoses   Final diagnoses:  Atypical chest pain    ED Discharge Orders    None     7:12 PM Patient here with complaints of right-sided chest pain and shortness of breath.  Pain is not reproducible on exam.  She has intact pulses to all 4 extremities.  She does not have any tenderness to her abdomen.  There are no overlying skin changes.  Patient is PERC negative, low suspicion for PE.  Her heart score is 2, low risk of ACS.  Will obtain delta troponin.  Will provide symptomatic treatment including morphine, and GI cocktail.  9:09 PM Pain is since resolved.  Normal delta troponin, pregnancy test negative, labs are reassuring, chest x-ray unremarkable.  At this time patient is stable for discharge with outpatient follow-up for further care.  Return  precaution discussed.   Domenic Moras, PA-C 04/05/18 2112    Malvin Johns, MD 04/05/18 2238

## 2018-04-09 ENCOUNTER — Ambulatory Visit: Admit: 2018-04-09 | Payer: Medicaid Other | Admitting: Obstetrics and Gynecology

## 2018-04-09 SURGERY — DILATATION AND CURETTAGE /HYSTEROSCOPY
Anesthesia: General

## 2018-05-24 ENCOUNTER — Encounter: Payer: BLUE CROSS/BLUE SHIELD | Admitting: Podiatry

## 2018-05-24 ENCOUNTER — Ambulatory Visit: Payer: BLUE CROSS/BLUE SHIELD

## 2018-05-24 DIAGNOSIS — M7750 Other enthesopathy of unspecified foot: Secondary | ICD-10-CM

## 2018-05-24 NOTE — Progress Notes (Signed)
This encounter was created in error - please disregard.

## 2018-05-28 ENCOUNTER — Emergency Department (HOSPITAL_COMMUNITY): Payer: Self-pay

## 2018-05-28 ENCOUNTER — Emergency Department (HOSPITAL_COMMUNITY)
Admission: EM | Admit: 2018-05-28 | Discharge: 2018-05-28 | Disposition: A | Discharge status: Home / Self Care | Payer: Self-pay | Attending: Emergency Medicine | Admitting: Emergency Medicine

## 2018-05-28 ENCOUNTER — Encounter (HOSPITAL_COMMUNITY): Payer: Self-pay | Admitting: Emergency Medicine

## 2018-05-28 ENCOUNTER — Other Ambulatory Visit: Payer: Self-pay

## 2018-05-28 DIAGNOSIS — Z79899 Other long term (current) drug therapy: Secondary | ICD-10-CM | POA: Insufficient documentation

## 2018-05-28 DIAGNOSIS — J45909 Unspecified asthma, uncomplicated: Secondary | ICD-10-CM | POA: Insufficient documentation

## 2018-05-28 DIAGNOSIS — R0789 Other chest pain: Secondary | ICD-10-CM | POA: Insufficient documentation

## 2018-05-28 LAB — CBC WITH DIFFERENTIAL/PLATELET
Abs Immature Granulocytes: 0.01 10*3/uL (ref 0.00–0.07)
Basophils Absolute: 0 10*3/uL (ref 0.0–0.1)
Basophils Relative: 1 %
Eosinophils Absolute: 0.1 10*3/uL (ref 0.0–0.5)
Eosinophils Relative: 2 %
HCT: 36.3 % (ref 36.0–46.0)
Hemoglobin: 11.3 g/dL — ABNORMAL LOW (ref 12.0–15.0)
Immature Granulocytes: 0 %
Lymphocytes Relative: 32 %
Lymphs Abs: 1.9 10*3/uL (ref 0.7–4.0)
MCH: 28.3 pg (ref 26.0–34.0)
MCHC: 31.1 g/dL (ref 30.0–36.0)
MCV: 90.8 fL (ref 80.0–100.0)
Monocytes Absolute: 0.5 10*3/uL (ref 0.1–1.0)
Monocytes Relative: 8 %
Neutro Abs: 3.3 10*3/uL (ref 1.7–7.7)
Neutrophils Relative %: 57 %
Platelets: 354 10*3/uL (ref 150–400)
RBC: 4 MIL/uL (ref 3.87–5.11)
RDW: 13.3 % (ref 11.5–15.5)
WBC: 5.7 10*3/uL (ref 4.0–10.5)
nRBC: 0 % (ref 0.0–0.2)

## 2018-05-28 LAB — BASIC METABOLIC PANEL
Anion gap: 6 (ref 5–15)
BUN: 10 mg/dL (ref 6–20)
CO2: 21 mmol/L — ABNORMAL LOW (ref 22–32)
Calcium: 8.9 mg/dL (ref 8.9–10.3)
Chloride: 110 mmol/L (ref 98–111)
Creatinine, Ser: 0.78 mg/dL (ref 0.44–1.00)
GFR calc Af Amer: >60 mL/min (ref >60)
GFR calc non Af Amer: >60 mL/min (ref >60)
Glucose, Bld: 114 mg/dL — ABNORMAL HIGH (ref 70–99)
Potassium: 3.9 mmol/L (ref 3.5–5.1)
Sodium: 137 mmol/L (ref 135–145)

## 2018-05-28 LAB — I-STAT BETA HCG BLOOD, ED (MC, WL, AP ONLY): I-stat hCG, quantitative: <5 m[IU]/mL (ref <5)

## 2018-05-28 LAB — TROPONIN I
Troponin I: <0.03 ng/mL (ref <0.03)
Troponin I: <0.03 ng/mL (ref <0.03)

## 2018-05-28 MED ORDER — KETOROLAC TROMETHAMINE 30 MG/ML IJ SOLN
30.0000 mg | Freq: Once | INTRAMUSCULAR | Status: AC
  Administered 2018-05-28: 30 mg via INTRAVENOUS
  Filled 2018-05-28: qty 1

## 2018-05-28 MED ORDER — IPRATROPIUM-ALBUTEROL 0.5-2.5 (3) MG/3ML IN SOLN
3.0000 mL | Freq: Once | RESPIRATORY_TRACT | Status: AC
  Administered 2018-05-28: 3 mL via RESPIRATORY_TRACT
  Filled 2018-05-28: qty 3

## 2018-05-28 NOTE — Discharge Instructions (Addendum)
Please follow up with PCP Harland Dingwall, NP or you can call the phone number on your discharge paperwork to see who you can establish care with; it is important that you have a primary care doctor; I would like you to see them regarding this chest pain that you have now experienced twice  Return to the ED for any worsening chest pain, shortness of breath, nausea, vomiting, clamminess

## 2018-05-28 NOTE — ED Provider Notes (Signed)
[facility] DEPART[facility] Provider Note   CSN: [id number] Arrival date & time: 05/28/18  1600    History   Chief Complaint Chief Complaint  Patient presents with  . Chest Pain    HPI Tracy Rosales is a 43 y.o. female with PMHx asthma and bronchitis who presents to the ED complaining of sudden onset, constant, pressure like, right sided chest pain that began a couple of hours ago. Pt reports she was laying down when the pain began. She also endorses shortness of breath, dry cough, and wheezing x 2 weeks which she has been dealing with on her own. She reports hx of asthma; recently had albuterol inhaler refilled and used it twice today without relief. No fever, chills, radiation of pain, pleuritic chest pain, nausea, vomiting, abdominal pain, or any other associated symptoms. No hx of MI in the past; pt had similar symptoms about 2 months ago and had a negative ACS workup at that time. Has never had a stress test or cardiac cath. No recent travel/prolonged travel. No recent exposure to COVID positive patient. Not on estrogen therapy. Former smoker; quit 10 years ago. No hx of malignancy.      Past Medical History:  Diagnosis Date  . Abnormal uterine bleeding   . Asthma   . Bronchitis   . Depression   . Endometriosis   . Headache(784.0)   . Obesity   . Uterine fibroid   . Vaginal Pap smear, abnormal    2017    Patient Active Problem List   Diagnosis Date Noted  . Obesity (BMI 30-39.9) 05/30/2017  . Family history of diabetes mellitus in mother 05/30/2017  . History of PID 05/30/2017  . Endometriosis 05/30/2017  . Fibroids 05/30/2017  . Female dyspareunia 05/30/2017  . Mild intermittent asthma 05/30/2017  . BV (bacterial vaginosis) 05/30/2017  . Pelvic pain in female 10/13/2011    Past Surgical History:  Procedure Laterality Date  . CESAREAN SECTION    . DILATION AND CURETTAGE OF UTERUS     for heavy periods      OB History    Gravida  2   Para  2   Term  2   Preterm      AB      Living  2     SAB      TAB      Ectopic      Multiple      Live Births               Home Medications    Prior to Admission medications   Medication Sig Start Date End Date Taking? Authorizing Provider  albuterol (PROVENTIL HFA;VENTOLIN HFA) 108 (90 Base) MCG/ACT inhaler Inhale 2 puffs into the lungs every 6 (six) hours as needed for wheezing or shortness of breath. Patient not taking: Reported on 04/05/2018 05/30/17   Harland Dingwall L, NP-C  metroNIDAZOLE (FLAGYL) 500 MG tablet Take 1 tablet (500 mg total) by mouth 2 (two) times daily. Patient not taking: Reported on 04/05/2018 05/30/17   Girtha Rm, NP-C    Family History Family History  Problem Relation Age of Onset  . Diabetes Mother   . Hypertension Mother   . Thyroid disease Maternal Aunt   . Uterine cancer Cousin   . Migraines Son     Social History Social History   Tobacco Use  . Smoking status: Never Smoker  . Smokeless tobacco: Never Used  Substance Use Topics  . Alcohol  use: Yes    Alcohol/week: 2.0 standard drinks    Types: 1 Glasses of wine, 1 Cans of beer per week    Comment: 1-6 beers daily   . Drug use: No     Allergies   Codeine   Review of Systems Review of Systems  Constitutional: Negative for fever.  Eyes: Negative for visual disturbance.  Respiratory: Positive for cough, shortness of breath and wheezing.   Cardiovascular: Positive for chest pain. Negative for palpitations and leg swelling.  Gastrointestinal: Negative for abdominal pain, nausea and vomiting.  Genitourinary: Negative for dysuria.  Musculoskeletal: Negative for joint swelling.  Skin: Negative for rash.  Neurological: Positive for weakness (Generalized).  Hematological: Does not bruise/bleed easily.     Physical Exam Updated Vital Signs BP 130/90 (BP Location: Right Arm)   Pulse 84   Temp 98.8 F (37.1 C) (Oral)   Resp 16   SpO2 97%   Physical Exam  Vitals signs and nursing note reviewed.  Constitutional:      Appearance: She is obese. She is not ill-appearing or diaphoretic.  HENT:     Head: Normocephalic and atraumatic.  Eyes:     Conjunctiva/sclera: Conjunctivae normal.  Neck:     Musculoskeletal: Neck supple.  Cardiovascular:     Rate and Rhythm: Normal rate and regular rhythm.     Pulses: Normal pulses.     Heart sounds: Normal heart sounds.  Pulmonary:     Effort: Pulmonary effort is normal.     Breath sounds: Normal breath sounds. No wheezing, rhonchi or rales.  Chest:     Chest wall: Tenderness (Reproducible chest wall tenderness on right) present.  Abdominal:     Palpations: Abdomen is soft.     Tenderness: There is no abdominal tenderness.  Musculoskeletal:        General: No swelling.     Right lower leg: No edema.     Left lower leg: No edema.  Skin:    General: Skin is warm and dry.  Neurological:     Mental Status: She is alert.      ED Treatments / Results  Labs (all labs ordered are listed, but only abnormal results are displayed) Labs Reviewed  BASIC METABOLIC PANEL - Abnormal; Notable for the following components:      Result Value   CO2 21 (*)    Glucose, Bld 114 (*)    All other components within normal limits  CBC WITH DIFFERENTIAL/PLATELET - Abnormal; Notable for the following components:   Hemoglobin 11.3 (*)    All other components within normal limits  TROPONIN I  TROPONIN I  I-STAT BETA HCG BLOOD, ED ([facility], [facility], [facility] ONLY)    EKG EKG Interpretation  Date/Time:  Tuesday May 28 2018 16:08:15 EDT Ventricular Rate:  82 PR Interval:    QRS Duration: 76 QT Interval:  375 QTC Calculation: 438 R Axis:   56 Text Interpretation:  Sinus rhythm Low voltage, precordial leads Borderline T abnormalities, diffuse leads No significant change since last tracing Confirmed by Lennice Sites [phone]) on 05/28/2018 4:13:50 PM   Radiology Dg Chest 2 View  Result Date: 05/28/2018 CLINICAL DATA:   Chest pain and shortness of breath for 2 weeks. EXAM: CHEST - 2 VIEW COMPARISON:  04/05/2018 FINDINGS: Cardiomediastinal silhouette is normal. Mediastinal contours appear intact. Chronic eventration of the left hemidiaphragm. There is no evidence of focal airspace consolidation, pleural effusion or pneumothorax. Osseous structures are without acute abnormality. Soft tissues are grossly normal. IMPRESSION:  No active cardiopulmonary disease. Electronically Signed   By: Fidela Salisbury M.D.   On: 05/28/2018 16:55    Procedures Procedures (including critical care time)  Medications Ordered in ED Medications  ipratropium-albuterol (DUONEB) 0.5-2.5 (3) MG/3ML nebulizer solution 3 mL (3 mLs Nebulization Given 05/28/18 1755)  ketorolac (TORADOL) 30 MG/ML injection 30 mg (30 mg Intravenous Given 05/28/18 1755)     Initial Impression / Assessment and Plan / ED Course  I have reviewed the triage vital signs and the nursing notes.  Pertinent labs & imaging results that were available during my care of the patient were reviewed by me and considered in my medical decision making (see chart for details).    Pt presents with sudden onset right sided chest pain that began a couple of hours ago. She also complains of SOB and dry cough x 2 weeks with generalized weakness. No recent travel or exposure to COVID + persons. Vital signs stable; no hypotension. Radial pulses 2+ bilaterally. DDx includes ACS, PE, pleurisy, musculoskeletal pain. Has had similar symptoms in Jan, seen in the ED on 01/31 for same; had negative ACS workup at that time. PERC negative at this time; do not feel the need for D dimer. EKG unremarkable. Will get CBC, BMP, troponin, and beta HCG. Lungs clear to auscultation at this time; do not feel pt is having an asthma exacerbation but will give duoneb treatment in ED. Will reevaluate once labs return.   6:10 PM Upon reeval pt had just finished breathing treatment and received IV Toradol 30  mg. Is currently asking for something to eat. Reports she is feeling mildly improved but still having pain. Workup negative including negative Troponin, CXR normal, all other labs within normal limits. Will get delta troponin in 1 hour to ensure it has not increased. Spoke with pt regarding previous visit in January and instructions to follow up with PCP; reports she never followed up and that it's difficult to see a provider.   9:20 PM Delta troponin negative. HEART Score: 1. Pt reports her pain has mildly improved with Toradol. She is unsure why she keeps having this pain. Had long discussion with her about importance of following up with PCP. Pt had PCP listed in system Harland Dingwall, NP. I have added her information to the discharge paperwork as well as given pt phone number that she can call to find a provider in her area. Pt is in agreement with plan and stable for discharge at this time.       Final Clinical Impressions(s) / ED Diagnoses   Final diagnoses:  Atypical chest pain    ED Discharge Orders    None       Eustaquio Maize, PA-C 05/29/18 0029    Lennice Sites, DO 05/30/18 [phone]

## 2018-05-28 NOTE — ED Notes (Signed)
Vital signs stable. 

## 2018-05-28 NOTE — ED Notes (Signed)
Patient transported to X-ray 

## 2018-05-28 NOTE — ED Notes (Signed)
Pt verbalized understanding of discharge instructions and denies any further questions at this time.   

## 2018-05-28 NOTE — ED Triage Notes (Signed)
Pt with chest pain after waking up today. Denise shortness breath.

## 2018-06-11 ENCOUNTER — Other Ambulatory Visit: Payer: Self-pay | Admitting: Podiatry

## 2018-06-11 ENCOUNTER — Ambulatory Visit (INDEPENDENT_AMBULATORY_CARE_PROVIDER_SITE_OTHER): Payer: BLUE CROSS/BLUE SHIELD

## 2018-06-11 ENCOUNTER — Ambulatory Visit (INDEPENDENT_AMBULATORY_CARE_PROVIDER_SITE_OTHER): Payer: BLUE CROSS/BLUE SHIELD | Admitting: Sports Medicine

## 2018-06-11 ENCOUNTER — Other Ambulatory Visit: Payer: Self-pay

## 2018-06-11 ENCOUNTER — Encounter: Payer: Self-pay | Admitting: Sports Medicine

## 2018-06-11 VITALS — BP 118/75 | HR 67 | Temp 97.9°F | Resp 16

## 2018-06-11 DIAGNOSIS — M722 Plantar fascial fibromatosis: Secondary | ICD-10-CM

## 2018-06-11 DIAGNOSIS — M7661 Achilles tendinitis, right leg: Secondary | ICD-10-CM | POA: Diagnosis not present

## 2018-06-11 DIAGNOSIS — M25571 Pain in right ankle and joints of right foot: Secondary | ICD-10-CM | POA: Diagnosis not present

## 2018-06-11 DIAGNOSIS — M25572 Pain in left ankle and joints of left foot: Secondary | ICD-10-CM

## 2018-06-11 MED ORDER — MELOXICAM 15 MG PO TABS: 15.0000 mg | ORAL_TABLET | Freq: Every day | ORAL | 0 refills | Status: DC

## 2018-06-11 MED ORDER — METHYLPREDNISOLONE 4 MG PO TBPK: ORAL_TABLET | ORAL | 0 refills | Status: DC

## 2018-06-11 NOTE — Progress Notes (Signed)
Subjective: Tracy Rosales is a 43 y.o. female patient who presents to office for evaluation of Right> Left heel pain. Patient complains of progressive pain especially over the last year in the Right>Left foot at the Achilles. Ranks pain 15/10 and is now interferring with daily activities. Patient has tried change of shoes, pads, creams and OTC with no relief in symptoms. Patient denies any other pedal complaints.   Review of Systems  Musculoskeletal: Positive for back pain, joint pain and myalgias.  All other systems reviewed and are negative.    Patient Active Problem List   Diagnosis Date Noted  . Obesity (BMI 30-39.9) 05/30/2017  . Family history of diabetes mellitus in mother 05/30/2017  . History of PID 05/30/2017  . Endometriosis 05/30/2017  . Fibroids 05/30/2017  . Female dyspareunia 05/30/2017  . Mild intermittent asthma 05/30/2017  . BV (bacterial vaginosis) 05/30/2017  . Pelvic pain in female 10/13/2011    Current Outpatient Medications on File Prior to Visit  Medication Sig Dispense Refill  . albuterol (PROVENTIL HFA;VENTOLIN HFA) 108 (90 Base) MCG/ACT inhaler Inhale 2 puffs into the lungs every 6 (six) hours as needed for wheezing or shortness of breath. 1 Inhaler 0   No current facility-administered medications on file prior to visit.     Allergies  Allergen Reactions  . Codeine Other (See Comments)    unknown    Objective:  General: Alert and oriented x3 in no acute distress  Dermatology: No open lesions bilateral lower extremities, no webspace macerations, no ecchymosis bilateral, all nails x 10 are well manicured.  Vascular: Dorsalis Pedis and Posterior Tibial pedal pulses 1/4, Capillary Fill Time 3 seconds, + pedal hair growth bilateral, no edema bilateral lower extremities, Temperature gradient within normal limits.  Neurology: Johney Maine sensation intact via light touch bilateral.   Musculoskeletal: Moderate tenderness with palpation at insertion of the Achilles  on Right>Left, Mild tenderness to plantar fascia L>R and to dorsum of left foot, there is calcaneal exostosis with mild soft tissue present and decreased ankle rom with knee extending  vs flexed resembling gastroc equnius bilateral, The achilles tendon feels intact with no nodularity or palpable dell, Thompson sign negative, Subtalar and midtarsal joint range of motion is within normal limits, there is no 1st ray hypermobility or forefoot deformity noted bilateral.   Gait: Antalgic gait with increased heel off right>left.   Xrays  Right/Left Foot    Impression: Normal osseous mineralization. Joint spaces preserved except at midfoot on left. No fracture/dislocation/boney destruction. Calcaneal spur present. Kager's triangle intact with no obliteration. No soft tissue abnormalities or radiopaque foreign bodies.   Assessment and Plan: Problem List Items Addressed This Visit    None    Visit Diagnoses    Acute bilateral ankle pain    -  Primary   Relevant Orders   DG Ankle Complete Right   DG Ankle Complete Left   Tendonitis, Achilles, right       Plantar fasciitis, bilateral          -Complete examination performed -Xrays reviewed -Discussed treatement options -Dispensed CAM boot to use on right  -Dispensed bilateral night splints and gentle stretching  -Rx Meloxicam and Medrol -No improvement will consider MRI/PT/EPAT or Surgery  -Patient to return to office in 1 month or sooner if condition worsens.  Landis Martins, DPM

## 2018-06-11 NOTE — Patient Instructions (Signed)
Achilles Tendinitis  Achilles tendinitis is inflammation of the tough, cord-like band that attaches the lower leg muscles to the heel bone (Achilles tendon). This is usually caused by overusing the tendon and the ankle joint. Achilles tendinitis usually gets better over time with treatment and caring for yourself at home. It can take weeks or months to heal completely. What are the causes? This condition may be caused by:  A sudden increase in exercise or activity, such as running.  Doing the same exercises or activities (such as jumping) over and over.  Not warming up calf muscles before exercising.  Exercising in shoes that are worn out or not made for exercise.  Having arthritis or a bone growth (spur) on the back of the heel bone. This can rub against the tendon and hurt it.  Age-related wear and tear. Tendons become less flexible with age and more likely to be injured. What are the signs or symptoms? Common symptoms of this condition include:  Pain in the Achilles tendon or in the back of the leg, just above the heel. The pain usually gets worse with exercise.  Stiffness or soreness in the back of the leg, especially in the morning.  Swelling of the skin over the Achilles tendon.  Thickening of the tendon.  Bone spurs at the bottom of the Achilles tendon, near the heel.  Trouble standing on tiptoe. How is this diagnosed? This condition is diagnosed based on your symptoms and a physical exam. You may have tests, including:  X-rays.  MRI. How is this treated? The goal of treatment is to relieve symptoms and help your injury heal. Treatment may include:  Decreasing or stopping activities that caused the tendinitis. This may mean switching to low-impact exercises like biking or swimming.  Icing the injured area.  Doing physical therapy, including strengthening and stretching exercises.  NSAIDs to help relieve pain and swelling.  Using supportive shoes, wraps, heel  lifts, or a walking boot (air cast).  Surgery. This may be done if your symptoms do not improve after 6 months.  Using high-energy shock wave impulses to stimulate the healing process (extracorporeal shock wave therapy). This is rare.  Injection of medicines to help relieve inflammation (corticosteroids). This is rare. Follow these instructions at home: If you have an air cast:  Wear the cast as told by your health care provider. Remove it only as told by your health care provider.  Loosen the cast if your toes tingle, become numb, or turn cold and blue. Activity  Gradually return to your normal activities once your health care provider approves. Do not do activities that cause pain. ? Consider doing low-impact exercises, like cycling or swimming.  If you have an air cast, ask your health care provider when it is safe for you to drive.  If physical therapy was prescribed, do exercises as told by your health care provider or physical therapist. Managing pain, stiffness, and swelling   Raise (elevate) your foot above the level of your heart while you are sitting or lying down.  Move your toes often to avoid stiffness and to lessen swelling.  If directed, put ice on the injured area: ? Put ice in a plastic bag. ? Place a towel between your skin and the bag. ? Leave the ice on for 20 minutes, 2-3 times a day General instructions  If directed, wrap your foot with an elastic bandage or other wrap. This can help keep your tendon from moving too much while it   heals. Your health care provider will show you how to wrap your foot correctly.  Wear supportive shoes or heel lifts only as told by your health care provider.  Take over-the-counter and prescription medicines only as told by your health care provider.  Keep all follow-up visits as told by your health care provider. This is important. Contact a health care provider if:  You have symptoms that gets worse.  You have pain that  does not get better with medicine.  You develop new, unexplained symptoms.  You develop warmth and swelling in your foot.  You have a fever. Get help right away if:  You have a sudden popping sound or sensation in your Achilles tendon followed by severe pain.  You cannot move your toes or foot.  You cannot put any weight on your foot. Summary  Achilles tendinitis is inflammation of the tough, cord-like band that attaches the lower leg muscles to the heel bone (Achilles tendon).  This condition is usually caused by overusing the tendon and the ankle joint. It can also be caused by arthritis or normal aging.  The most common symptoms of this condition include pain, swelling, or stiffness in the Achilles tendon or in the back of the leg.  This condition is usually treated with rest, NSAIDs, and physical therapy. This information is not intended to replace advice given to you by your health care provider. Make sure you discuss any questions you have with your health care provider. Document Released: 11/30/2004 Document Revised: 01/10/2016 Document Reviewed: 01/10/2016 Elsevier Interactive Patient Education  2019 [employer]   

## 2018-06-13 ENCOUNTER — Encounter: Payer: Self-pay | Admitting: Sports Medicine

## 2018-06-16 ENCOUNTER — Encounter: Payer: Self-pay | Admitting: Sports Medicine

## 2018-07-01 ENCOUNTER — Ambulatory Visit (HOSPITAL_COMMUNITY)
Admission: EM | Admit: 2018-07-01 | Discharge: 2018-07-01 | Disposition: A | Discharge status: Home / Self Care | Payer: Self-pay | Attending: Family Medicine | Admitting: Family Medicine

## 2018-07-01 ENCOUNTER — Encounter (HOSPITAL_COMMUNITY): Payer: Self-pay | Admitting: Emergency Medicine

## 2018-07-01 ENCOUNTER — Other Ambulatory Visit: Payer: Self-pay

## 2018-07-01 DIAGNOSIS — Z0289 Encounter for other administrative examinations: Secondary | ICD-10-CM

## 2018-07-01 DIAGNOSIS — Z09 Encounter for follow-up examination after completed treatment for conditions other than malignant neoplasm: Secondary | ICD-10-CM

## 2018-07-01 MED ORDER — ALBUTEROL SULFATE HFA 108 (90 BASE) MCG/ACT IN AERS: 1.0000 | INHALATION_SPRAY | Freq: Four times a day (QID) | RESPIRATORY_TRACT | 0 refills | Status: DC | PRN

## 2018-07-01 NOTE — Discharge Instructions (Signed)
May return to work Refilled albuterol Follow up if developing worsening cough, fever, chills, body aches, shortness of breath

## 2018-07-01 NOTE — ED Triage Notes (Signed)
Pt here for a note to clear her back to work.  Pt states she was sent home on April 17 for answering yes to diarrhea and body aches on a health questionnaire.  She was told to stay home for three days and when she returned to work, she was told the policy is to stay home 7 days.  She made an appointment to be seen here today last Wednesday to return to work.  Pt states she has "really bad asthma and bronchitis" currently, but she is feeling fine and needs the note to return to work.  No fever, body aches, N, V, or D at this time.  She states she has the cough and SOB from her bronchitis and asthma.

## 2018-07-01 NOTE — ED Provider Notes (Signed)
[facility]    CSN: [id number] Arrival date & time: 07/01/18  1051     History   Chief Complaint Chief Complaint  Patient presents with  . work note    HPI Tracy Rosales is a 43 y.o. female history of asthma, endometriosis, presenting today for work clearance.  Patient states that on 417 she had a spell of diarrhea which she indicated on her work health screening.  She was advised she needed to have a full week off for work and requiring note that she may return to work.  She states that she believes that she ate something that caused the diarrhea as this resolved relatively quickly.  She denies any nausea or vomiting.  Denies ever having fevers, chills or body aches.  She has had an infrequent cough which she relates to her asthma.  She notes that she mainly notices it in cold environments such as the freezer at work or gust of wind.  States that this is her normal cough she has with her asthma.  Denies worsening.  Denies shortness of breath. Denies any recent travel, denies any known exposure to COVID-19.    HPI  Past Medical History:  Diagnosis Date  . Abnormal uterine bleeding   . Asthma   . Bronchitis   . Depression   . Endometriosis   . Headache(784.0)   . Obesity   . Uterine fibroid   . Vaginal Pap smear, abnormal    2017    Patient Active Problem List   Diagnosis Date Noted  . Obesity (BMI 30-39.9) 05/30/2017  . Family history of diabetes mellitus in mother 05/30/2017  . History of PID 05/30/2017  . Endometriosis 05/30/2017  . Fibroids 05/30/2017  . Female dyspareunia 05/30/2017  . Mild intermittent asthma 05/30/2017  . BV (bacterial vaginosis) 05/30/2017  . Pelvic pain in female 10/13/2011    Past Surgical History:  Procedure Laterality Date  . CESAREAN SECTION    . DILATION AND CURETTAGE OF UTERUS     for heavy periods     OB History    Gravida  2   Para  2   Term  2   Preterm      AB      Living  2     SAB      TAB      Ectopic      Multiple      Live Births               Home Medications    Prior to Admission medications   Medication Sig Start Date End Date Taking? Authorizing Provider  methylPREDNISolone (MEDROL DOSEPAK) 4 MG TBPK tablet Take as directed 06/11/18  Yes Stover, Titorya, DPM  albuterol (VENTOLIN HFA) 108 (90 Base) MCG/ACT inhaler Inhale 1-2 puffs into the lungs every 6 (six) hours as needed for wheezing or shortness of breath. 07/01/18   ,  C, PA-C  meloxicam (MOBIC) 15 MG tablet Take 1 tablet (15 mg total) by mouth daily. Take after finished with steroid 06/11/18   Landis Martins, DPM    Family History Family History  Problem Relation Age of Onset  . Diabetes Mother   . Hypertension Mother   . Thyroid disease Maternal Aunt   . Uterine cancer Cousin   . Migraines Son     Social History Social History   Tobacco Use  . Smoking status: Never Smoker  . Smokeless tobacco: Never Used  Substance Use Topics  . Alcohol  use: Yes    Alcohol/week: 2.0 standard drinks    Types: 1 Glasses of wine, 1 Cans of beer per week    Comment: 1-6 beers daily   . Drug use: No     Allergies   Codeine   Review of Systems Review of Systems  Constitutional: Negative for activity change, appetite change, chills, fatigue and fever.  HENT: Negative for congestion, ear pain, rhinorrhea, sinus pressure, sore throat and trouble swallowing.   Eyes: Negative for discharge and redness.  Respiratory: Positive for cough. Negative for chest tightness and shortness of breath.   Cardiovascular: Negative for chest pain.  Gastrointestinal: Negative for abdominal pain, diarrhea, nausea and vomiting.  Musculoskeletal: Negative for myalgias.  Skin: Negative for rash.  Neurological: Negative for dizziness, light-headedness and headaches.     Physical Exam Triage Vital Signs ED Triage Vitals  Enc Vitals Group     BP 07/01/18 1110 (!) 140/103     Pulse Rate 07/01/18 1110 76     Resp --       Temp 07/01/18 1110 97.7 F (36.5 C)     Temp Source 07/01/18 1110 Oral     SpO2 07/01/18 1110 97 %     Weight --      Height --      Head Circumference --      Peak Flow --      Pain Score 07/01/18 1108 0     Pain Loc --      Pain Edu? --      Excl. in [place]? --    No data found.  Updated Vital Signs BP (!) 140/103 (BP Location: Right Wrist)   Pulse 76   Temp 97.7 F (36.5 C) (Oral)   LMP 06/28/2018 (Exact Date)   SpO2 97%   Visual Acuity Right Eye Distance:   Left Eye Distance:   Bilateral Distance:    Right Eye Near:   Left Eye Near:    Bilateral Near:     Physical Exam Vitals signs and nursing note reviewed.  Constitutional:      General: She is not in acute distress.    Appearance: She is well-developed.  HENT:     Head: Normocephalic and atraumatic.     Ears:     Comments: Bilateral ears without tenderness to palpation of external auricle, tragus and mastoid, EAC's without erythema or swelling, TM's with good bony landmarks and cone of light. Non erythematous.    Mouth/Throat:     Comments: Oral mucosa pink and moist, no tonsillar enlargement or exudate. Posterior pharynx patent and nonerythematous, no uvula deviation or swelling. Normal phonation. Eyes:     Conjunctiva/sclera: Conjunctivae normal.  Neck:     Musculoskeletal: Neck supple.  Cardiovascular:     Rate and Rhythm: Normal rate and regular rhythm.     Heart sounds: No murmur.  Pulmonary:     Effort: Pulmonary effort is normal. No respiratory distress.     Breath sounds: Normal breath sounds.     Comments: Breathing comfortably at rest, CTABL, no wheezing, rales or other adventitious sounds auscultated No coughing during visit Abdominal:     Palpations: Abdomen is soft.     Tenderness: There is no abdominal tenderness.  Skin:    General: Skin is warm and dry.  Neurological:     Mental Status: She is alert.      UC Treatments / Results  Labs (all labs ordered are listed, but only  abnormal results are displayed)  Labs Reviewed - No data to display  EKG None  Radiology No results found.  Procedures Procedures (including critical care time)  Medications Ordered in UC Medications - No data to display  Initial Impression / Assessment and Plan / UC Course  I have reviewed the triage vital signs and the nursing notes.  Pertinent labs & imaging results that were available during my care of the patient were reviewed by me and considered in my medical decision making (see chart for details).     Patient with resolution of symptoms initially sent home for.  Vital signs stable in clinic today and currently not exhibiting symptoms suggestive of COVID-19.  Does have infrequent cough, but this appears to be her baseline cough she has with her asthma.  Lungs clear, breathing comfortably, no coughing in room.  Will provide work note to return to work as feel COVID-19 is unlikely at this time.  Refilled albuterol inhaler to use as needed.  Continue to monitor symptoms, temperature and breathing, Discussed strict return precautions. Patient verbalized understanding and is agreeable with plan.  Final Clinical Impressions(s) / UC Diagnoses   Final diagnoses:  Follow up  Encounter to obtain excuse from work     Discharge Instructions     May return to work Refilled albuterol Follow up if developing worsening cough, fever, chills, body aches, shortness of breath   ED Prescriptions    Medication Sig Dispense Auth. Provider   albuterol (VENTOLIN HFA) 108 (90 Base) MCG/ACT inhaler Inhale 1-2 puffs into the lungs every 6 (six) hours as needed for wheezing or shortness of breath. 1 Inhaler ,  C, PA-C     Controlled Substance Prescriptions [place] Controlled Substance Registry consulted? Not Applicable   Janith Lima, [place] 07/01/18 1153

## 2018-07-09 ENCOUNTER — Ambulatory Visit: Payer: BLUE CROSS/BLUE SHIELD | Admitting: Sports Medicine

## 2018-07-09 ENCOUNTER — Telehealth: Payer: Self-pay | Admitting: Sports Medicine

## 2018-07-09 MED ORDER — MELOXICAM 15 MG PO TABS: 15.0000 mg | ORAL_TABLET | Freq: Every day | ORAL | 0 refills | Status: DC

## 2018-07-09 NOTE — Telephone Encounter (Signed)
Pt is requesting pain medication. 

## 2018-07-09 NOTE — Telephone Encounter (Signed)
I called pt and asked pt if she would like a refill of the meloxicam it was for pain and inflammation. Pt stated she needed something so she could continue to work. I told pt that she could take the meloxicam if it did not cause drowsiness and work and to for additional pain coverage, if she could tolerate regular strength tylenol, take as the package directs. I told pt, if she was given a narcotic pain medication she would not be able to take it and work.

## 2018-07-09 NOTE — Addendum Note (Signed)
Addended by: Harriett Sine D on: 07/09/2018 11:15 AM   Modules accepted: Orders

## 2018-07-16 ENCOUNTER — Encounter: Payer: Self-pay | Admitting: Sports Medicine

## 2018-07-16 ENCOUNTER — Other Ambulatory Visit: Payer: Self-pay

## 2018-07-16 ENCOUNTER — Ambulatory Visit (INDEPENDENT_AMBULATORY_CARE_PROVIDER_SITE_OTHER): Payer: BLUE CROSS/BLUE SHIELD | Admitting: Sports Medicine

## 2018-07-16 VITALS — Temp 95.4°F

## 2018-07-16 DIAGNOSIS — M79671 Pain in right foot: Secondary | ICD-10-CM | POA: Diagnosis not present

## 2018-07-16 DIAGNOSIS — M7661 Achilles tendinitis, right leg: Secondary | ICD-10-CM | POA: Diagnosis not present

## 2018-07-16 MED ORDER — TRIAMCINOLONE ACETONIDE 10 MG/ML IJ SUSP
10.0000 mg | Freq: Once | INTRAMUSCULAR | Status: AC
  Administered 2018-07-16: 10 mg

## 2018-07-16 MED ORDER — OXYCODONE-ACETAMINOPHEN 7.5-325 MG PO TABS: 1.0000 | ORAL_TABLET | Freq: Four times a day (QID) | ORAL | 0 refills | Status: AC | PRN

## 2018-07-16 NOTE — Progress Notes (Signed)
Subjective: Tracy Rosales is a 43 y.o. female patient who returns to office for follow up eval of R>L foot/heel pain. Reports boot rubbed her leg and is not helping. Pads and medications are not helping anymore. This last week was on her foot a lot and the pain is getting worse 8/10 sharp that even wakes her up at night. Patient denies any other pedal complaints or symptoms at this time.   Patient Active Problem List   Diagnosis Date Noted  . Obesity (BMI 30-39.9) 05/30/2017  . Family history of diabetes mellitus in mother 05/30/2017  . History of PID 05/30/2017  . Endometriosis 05/30/2017  . Fibroids 05/30/2017  . Female dyspareunia 05/30/2017  . Mild intermittent asthma 05/30/2017  . BV (bacterial vaginosis) 05/30/2017  . Pelvic pain in female 10/13/2011    Current Outpatient Medications on File Prior to Visit  Medication Sig Dispense Refill  . albuterol (VENTOLIN HFA) 108 (90 Base) MCG/ACT inhaler Inhale 1-2 puffs into the lungs every 6 (six) hours as needed for wheezing or shortness of breath. 1 Inhaler 0  . meloxicam (MOBIC) 15 MG tablet Take 1 tablet (15 mg total) by mouth daily. Take after finished with steroid 30 tablet 0  . methylPREDNISolone (MEDROL DOSEPAK) 4 MG TBPK tablet Take as directed 21 tablet 0   No current facility-administered medications on file prior to visit.     Allergies  Allergen Reactions  . Codeine Other (See Comments)    unknown    Objective:  General: Alert and oriented x3 in no acute distress  Dermatology: No open lesions bilateral lower extremities, no webspace macerations, no ecchymosis bilateral, all nails x 10 are well manicured.  Vascular: Dorsalis Pedis and Posterior Tibial pedal pulses 1/4, Capillary Fill Time 3 seconds, + pedal hair growth bilateral, no edema bilateral lower extremities, Temperature gradient within normal limits.  Neurology: Johney Maine sensation intact via light touch bilateral.   Musculoskeletal: Moderate tenderness with  palpation at insertion of the Achilles on Right>Left at lateral insertion, Mild tenderness to plantar fascia L>R and to dorsum of left foot, there is calcaneal exostosis with mild soft tissue present and decreased ankle rom with knee extending  vs flexed resembling gastroc equnius bilateral, The achilles tendon feels intact with no nodularity or palpable dell, Thompson sign negative, Subtalar and midtarsal joint range of motion is within normal limits, there is no 1st ray hypermobility or forefoot deformity noted bilateral.   Assessment and Plan: Problem List Items Addressed This Visit    None    Visit Diagnoses    Tendonitis, Achilles, right    -  Primary   Inflammatory pain of right heel       Relevant Medications   triamcinolone acetonide (KENALOG) 10 MG/ML injection 10 mg (Completed)   oxyCODONE-acetaminophen (PERCOCET) 7.5-325 MG tablet      -Complete examination performed -Re-Discussed treatement options -After oral consent and aseptic prep, injected a mixture containing 1 ml of 2%  plain lidocaine, 1 ml 0.5% plain marcaine, 0.5 ml of kenalog 10 and 0.5 ml of dexamethasone phosphate into right heel lateral achilles insertion without complication. Post-injection care discussed with patient.  -Continue with CAM boot to use on right and heel lifts -Continue with bilateral night splints and gentle stretching  -Rx Percocet for severe pain at end of day and continue with Meloxicam in AM -No improvement will consider MRI/PT/EPAT or Surgery at this time patient wants to hold off on surgery -Work note given for today to excuse her from work  due to pain and injection -Patient to return to office in 3-4 weeks or sooner if condition worsens.  Landis Martins, DPM

## 2018-08-06 ENCOUNTER — Other Ambulatory Visit: Payer: Self-pay | Admitting: Sports Medicine

## 2018-08-06 ENCOUNTER — Telehealth: Payer: Self-pay | Admitting: Sports Medicine

## 2018-08-06 ENCOUNTER — Ambulatory Visit: Payer: BLUE CROSS/BLUE SHIELD | Admitting: Sports Medicine

## 2018-08-06 ENCOUNTER — Encounter: Payer: Self-pay | Admitting: Sports Medicine

## 2018-08-06 NOTE — Telephone Encounter (Signed)
Pt called requesting a refill on her pain medication. Pt was scheduled for an appt today and missed the appt. Pt rescheduled appt for 6/23.

## 2018-08-06 NOTE — Telephone Encounter (Signed)
Unable to leave a message to discuss pt's current pain, voicemail box is not set up.

## 2018-08-07 MED ORDER — TRAMADOL HCL 50 MG PO TABS: 50.0000 mg | ORAL_TABLET | Freq: Three times a day (TID) | ORAL | 0 refills | Status: AC | PRN

## 2018-08-07 MED ORDER — MELOXICAM 15 MG PO TABS: 15.0000 mg | ORAL_TABLET | Freq: Every day | ORAL | 0 refills | Status: DC

## 2018-08-07 NOTE — Telephone Encounter (Signed)
Emailed through [employer] Dr. Leeanne Rio request for more information concerning her pain.

## 2018-08-07 NOTE — Telephone Encounter (Signed)
I emailed through [facility] that Dr. Cannon Kettle would order the tramadol. 12:09pm - Left message with order for Tramadol at [facility].

## 2018-08-24 ENCOUNTER — Encounter (HOSPITAL_COMMUNITY): Payer: Self-pay

## 2018-08-24 ENCOUNTER — Emergency Department (HOSPITAL_COMMUNITY)
Admission: EM | Admit: 2018-08-24 | Discharge: 2018-08-24 | Disposition: A | Discharge status: Home / Self Care | Payer: Self-pay | Attending: Emergency Medicine | Admitting: Emergency Medicine

## 2018-08-24 ENCOUNTER — Other Ambulatory Visit: Payer: Self-pay

## 2018-08-24 ENCOUNTER — Emergency Department (HOSPITAL_COMMUNITY): Payer: Self-pay

## 2018-08-24 DIAGNOSIS — R079 Chest pain, unspecified: Secondary | ICD-10-CM

## 2018-08-24 DIAGNOSIS — R0789 Other chest pain: Secondary | ICD-10-CM | POA: Insufficient documentation

## 2018-08-24 DIAGNOSIS — J45909 Unspecified asthma, uncomplicated: Secondary | ICD-10-CM | POA: Insufficient documentation

## 2018-08-24 LAB — CBC WITH DIFFERENTIAL/PLATELET
Abs Immature Granulocytes: 0.01 10*3/uL (ref 0.00–0.07)
Basophils Absolute: 0 10*3/uL (ref 0.0–0.1)
Basophils Relative: 0 %
Eosinophils Absolute: 0.2 10*3/uL (ref 0.0–0.5)
Eosinophils Relative: 3 %
HCT: 35.3 % — ABNORMAL LOW (ref 36.0–46.0)
Hemoglobin: 11.2 g/dL — ABNORMAL LOW (ref 12.0–15.0)
Immature Granulocytes: 0 %
Lymphocytes Relative: 22 %
Lymphs Abs: 1.4 10*3/uL (ref 0.7–4.0)
MCH: 28.4 pg (ref 26.0–34.0)
MCHC: 31.7 g/dL (ref 30.0–36.0)
MCV: 89.4 fL (ref 80.0–100.0)
Monocytes Absolute: 0.4 10*3/uL (ref 0.1–1.0)
Monocytes Relative: 6 %
Neutro Abs: 4.2 10*3/uL (ref 1.7–7.7)
Neutrophils Relative %: 69 %
Platelets: 330 10*3/uL (ref 150–400)
RBC: 3.95 MIL/uL (ref 3.87–5.11)
RDW: 13.5 % (ref 11.5–15.5)
WBC: 6.1 10*3/uL (ref 4.0–10.5)
nRBC: 0 % (ref 0.0–0.2)

## 2018-08-24 LAB — COMPREHENSIVE METABOLIC PANEL
ALT: 32 U/L (ref 0–44)
AST: 26 U/L (ref 15–41)
Albumin: 3.7 g/dL (ref 3.5–5.0)
Alkaline Phosphatase: 46 U/L (ref 38–126)
Anion gap: 9 (ref 5–15)
BUN: 13 mg/dL (ref 6–20)
CO2: 22 mmol/L (ref 22–32)
Calcium: 8.7 mg/dL — ABNORMAL LOW (ref 8.9–10.3)
Chloride: 108 mmol/L (ref 98–111)
Creatinine, Ser: 0.64 mg/dL (ref 0.44–1.00)
GFR calc Af Amer: >60 mL/min (ref >60)
GFR calc non Af Amer: >60 mL/min (ref >60)
Glucose, Bld: 99 mg/dL (ref 70–99)
Potassium: 3.9 mmol/L (ref 3.5–5.1)
Sodium: 139 mmol/L (ref 135–145)
Total Bilirubin: 0.4 mg/dL (ref 0.3–1.2)
Total Protein: 6.8 g/dL (ref 6.5–8.1)

## 2018-08-24 LAB — CBG MONITORING, ED: Glucose-Capillary: 99 mg/dL (ref 70–99)

## 2018-08-24 LAB — TROPONIN I
Troponin I: <0.03 ng/mL (ref <0.03)
Troponin I: <0.03 ng/mL (ref <0.03)

## 2018-08-24 LAB — I-STAT BETA HCG BLOOD, ED (MC, WL, AP ONLY): I-stat hCG, quantitative: <5 m[IU]/mL (ref <5)

## 2018-08-24 LAB — PROTIME-INR
INR: 0.9 (ref 0.8–1.2)
Prothrombin Time: 12.5 seconds (ref 11.4–15.2)

## 2018-08-24 LAB — BRAIN NATRIURETIC PEPTIDE: B Natriuretic Peptide: 22 pg/mL (ref 0.0–100.0)

## 2018-08-24 LAB — MAGNESIUM: Magnesium: 2 mg/dL (ref 1.7–2.4)

## 2018-08-24 MED ORDER — ASPIRIN 81 MG PO CHEW
324.0000 mg | CHEWABLE_TABLET | Freq: Once | ORAL | Status: AC
  Administered 2018-08-24: 324 mg via ORAL
  Filled 2018-08-24: qty 4

## 2018-08-24 NOTE — ED Triage Notes (Addendum)
Pt arrives POV from home c/o right sided chest pain that radiates up to neck. Pt states CP started last night and increased overnight. Pt reports pain 9/10. Pt reports taking "2 gelcaps of Naprosyn" this morning at 11am.

## 2018-08-24 NOTE — ED Notes (Signed)
ED Provider at bedside. 

## 2018-08-24 NOTE — ED Notes (Signed)
Patient Alert and oriented to baseline. Stable and ambulatory to baseline. Patient verbalized understanding of the discharge instructions.  Patient belongings were taken by the patient.   

## 2018-08-24 NOTE — ED Notes (Signed)
Pt refused wheelchair.  

## 2018-08-24 NOTE — ED Provider Notes (Signed)
[facility] EMERGENCY DEPARTMENT Provider Note   CSN: [id number] Arrival date & time: 08/24/18  1416     History   Chief Complaint Chief Complaint  Patient presents with  . Chest Pain    HPI Tracy Rosales is a 43 y.o. female.     HPI Patient presents concern of chest pain. Pain is right-sided, sore, onset was yesterday, mild, but in increased over the past day. Pain is focal with no new dyspnea, no fever, no cough. It is unclear if she has taken any medication for pain relief. She notes that she was here about 1 month ago, and had similar pain, had evaluation that was reassuring. Non-smoker, has history of bronchitis and asthma, has no family history of early cardiac death. Past Medical History:  Diagnosis Date  . Abnormal uterine bleeding   . Asthma   . Bronchitis   . Depression   . Endometriosis   . Headache(784.0)   . Obesity   . Uterine fibroid   . Vaginal Pap smear, abnormal    2017    Patient Active Problem List   Diagnosis Date Noted  . Obesity (BMI 30-39.9) 05/30/2017  . Family history of diabetes mellitus in mother 05/30/2017  . History of PID 05/30/2017  . Endometriosis 05/30/2017  . Fibroids 05/30/2017  . Female dyspareunia 05/30/2017  . Mild intermittent asthma 05/30/2017  . BV (bacterial vaginosis) 05/30/2017  . Pelvic pain in female 10/13/2011    Past Surgical History:  Procedure Laterality Date  . CESAREAN SECTION    . DILATION AND CURETTAGE OF UTERUS     for heavy periods      OB History    Gravida  2   Para  2   Term  2   Preterm      AB      Living  2     SAB      TAB      Ectopic      Multiple      Live Births               Home Medications    Prior to Admission medications   Medication Sig Start Date End Date Taking? Authorizing Provider  albuterol (VENTOLIN HFA) 108 (90 Base) MCG/ACT inhaler Inhale 1-2 puffs into the lungs every 6 (six) hours as needed for wheezing or shortness of  breath. 07/01/18   Wieters, Hallie C, PA-C  meloxicam (MOBIC) 15 MG tablet Take 1 tablet (15 mg total) by mouth daily. Take after finished with steroid 08/07/18   Landis Martins, DPM  methylPREDNISolone (MEDROL DOSEPAK) 4 MG TBPK tablet Take as directed 06/11/18   Landis Martins, DPM    Family History Family History  Problem Relation Age of Onset  . Diabetes Mother   . Hypertension Mother   . Thyroid disease Maternal Aunt   . Uterine cancer Cousin   . Migraines Son     Social History Social History   Tobacco Use  . Smoking status: Never Smoker  . Smokeless tobacco: Never Used  Substance Use Topics  . Alcohol use: Yes    Alcohol/week: 2.0 standard drinks    Types: 1 Glasses of wine, 1 Cans of beer per week    Comment: pt reports drinking 3-4 drinks per week  . Drug use: No     Allergies   Codeine   Review of Systems Review of Systems  Constitutional:       Per HPI, otherwise negative  HENT:       Per HPI, otherwise negative  Respiratory:       Per HPI, otherwise negative  Cardiovascular:       Per HPI, otherwise negative  Gastrointestinal: Negative for vomiting.  Endocrine:       Negative aside from HPI  Genitourinary:       Neg aside from HPI   Musculoskeletal:       Per HPI, otherwise negative  Skin: Negative.   Neurological: Negative for syncope.     Physical Exam Updated Vital Signs BP 110/76 (BP Location: Right Arm)   Pulse 81   Temp 98.8 F (37.1 C) (Oral)   Resp 16   Ht 5\' 4"  (1.626 m)   Wt 99.8 kg   SpO2 100%   BMI 37.76 kg/m   Physical Exam Vitals signs and nursing note reviewed.  Constitutional:      General: She is not in acute distress.    Appearance: She is well-developed.  HENT:     Head: Normocephalic and atraumatic.  Eyes:     Conjunctiva/sclera: Conjunctivae normal.  Cardiovascular:     Rate and Rhythm: Normal rate and regular rhythm.  Pulmonary:     Effort: Pulmonary effort is normal. No respiratory distress.     Breath  sounds: Normal breath sounds. No stridor.  Abdominal:     General: There is no distension.  Skin:    General: Skin is warm and dry.  Neurological:     Mental Status: She is alert and oriented to person, place, and time.     Cranial Nerves: No cranial nerve deficit.      ED Treatments / Results  Labs (all labs ordered are listed, but only abnormal results are displayed) Labs Reviewed  COMPREHENSIVE METABOLIC PANEL  MAGNESIUM  TROPONIN I  BRAIN NATRIURETIC PEPTIDE  CBC WITH DIFFERENTIAL/PLATELET  PROTIME-INR  CBG MONITORING, ED  I-STAT BETA HCG BLOOD, ED ([facility], [facility], [facility] ONLY)    EKG    Radiology No results found.  Procedures Procedures (including critical care time)  Medications Ordered in ED Medications  aspirin chewable tablet 324 mg (has no administration in time range)     Initial Impression / Assessment and Plan / ED Course  I have reviewed the triage vital signs and the nursing notes.  Pertinent labs & imaging results that were available during my care of the patient were reviewed by me and considered in my medical decision making (see chart for details).  This 43 year old female presents with right-sided chest pain.  She is awake, alert, hemodynamically unremarkable initially.  Patient's initial evaluation is reassuring, but patient will require additional monitoring, management including troponin x2. Patient may be appropriate for discharge. Dr. Billy Fischer is aware of the patient, will reassess and disposition appropriately.  Final Clinical Impressions(s) / ED Diagnoses  atypical chest pain   Carmin Muskrat, MD 08/24/18 [id number]

## 2018-08-24 NOTE — ED Provider Notes (Signed)
  Physical Exam  BP 110/76 (BP Location: Right Arm)   Pulse 81   Temp 98.8 F (37.1 C) (Oral)   Resp 16   Ht 5\' 4"  (1.626 m)   Wt 99.8 kg   SpO2 100%   BMI 37.76 kg/m   Physical Exam Constitutional:      General: She is not in acute distress.    Appearance: She is well-developed.  HENT:     Head: Normocephalic and atraumatic.  Cardiovascular:     Rate and Rhythm: Normal rate.  Pulmonary:     Effort: Pulmonary effort is normal. No tachypnea or respiratory distress.  Neurological:     Mental Status: She is alert.     ED Course/Procedures     Procedures  MDM  43 year old female presents with concern for chest pain.  Please see Dr. Darleene Cleaver note for prior history, physical and care.  Briefly, patient presented with chest pain located on the right side, but also across the chest, but has occurred intermittently over the last 6 months.  Happens spontaneously, but necessarily associated with exertion.  She describes the pain to me as a tightness.  She is PERC negative and low risk Wells have low suspicion for pulmonary embolus.  No history or signs to suggest aortic dissection.  Delta troponin obtained and shows no evidence of ischemia.  Patient stable for outpatient follow-up.  Placed case management consult to help obtain a primary care physician to follow-up with.       Gareth Morgan, MD 08/25/18 1224

## 2018-08-27 ENCOUNTER — Ambulatory Visit: Payer: BC Managed Care – PPO | Admitting: Sports Medicine

## 2018-09-13 ENCOUNTER — Encounter: Payer: Self-pay | Admitting: Sports Medicine

## 2018-09-17 ENCOUNTER — Ambulatory Visit: Payer: BC Managed Care – PPO | Admitting: Sports Medicine

## 2018-11-03 ENCOUNTER — Encounter: Payer: Self-pay | Admitting: Sports Medicine

## 2018-11-03 ENCOUNTER — Other Ambulatory Visit: Payer: Self-pay | Admitting: Sports Medicine

## 2018-11-04 ENCOUNTER — Telehealth: Payer: Self-pay | Admitting: Sports Medicine

## 2018-11-04 NOTE — Telephone Encounter (Signed)
Patient sent mychart message to schedule an appt. Pt was called but did not answer and was unable to leave voicemail, mailbox not set up.

## 2018-11-18 ENCOUNTER — Ambulatory Visit (HOSPITAL_COMMUNITY)
Admission: EM | Admit: 2018-11-18 | Discharge: 2018-11-18 | Disposition: A | Discharge status: Home / Self Care | Payer: BC Managed Care – PPO | Attending: Family Medicine | Admitting: Family Medicine

## 2018-11-18 ENCOUNTER — Other Ambulatory Visit: Payer: Self-pay

## 2018-11-18 ENCOUNTER — Encounter (HOSPITAL_COMMUNITY): Payer: Self-pay | Admitting: Emergency Medicine

## 2018-11-18 DIAGNOSIS — R197 Diarrhea, unspecified: Secondary | ICD-10-CM | POA: Diagnosis not present

## 2018-11-18 MED ORDER — ONDANSETRON 8 MG PO TBDP: 8.0000 mg | ORAL_TABLET | Freq: Three times a day (TID) | ORAL | 0 refills | Status: DC | PRN

## 2018-11-18 NOTE — ED Triage Notes (Signed)
Pt here for letter for work; pt sts diarrhea and cramping x several days that is now improved

## 2018-11-18 NOTE — Discharge Instructions (Addendum)
Hold off on your current medications  Clear liquids and crackers only today  The COVID-19 saliva test is available at [facility] near [employer]

## 2018-11-18 NOTE — ED Provider Notes (Signed)
[facility]    CSN: [id number] Arrival date & time: 11/18/18  1412      History   Chief Complaint Chief Complaint  Patient presents with  . Letter for School/Work  . Abdominal Pain    HPI Tracy Rosales is a 43 y.o. female.   This is a 43 year old woman who is an established most: Urgent care patient.  Pt here for letter for work; pt sts diarrhea and cramping x several days that is now improved      Past Medical History:  Diagnosis Date  . Abnormal uterine bleeding   . Asthma   . Bronchitis   . Depression   . Endometriosis   . Headache(784.0)   . Obesity   . Uterine fibroid   . Vaginal Pap smear, abnormal    2017    Patient Active Problem List   Diagnosis Date Noted  . Obesity (BMI 30-39.9) 05/30/2017  . Family history of diabetes mellitus in mother 05/30/2017  . History of PID 05/30/2017  . Endometriosis 05/30/2017  . Fibroids 05/30/2017  . Female dyspareunia 05/30/2017  . Mild intermittent asthma 05/30/2017  . BV (bacterial vaginosis) 05/30/2017  . Pelvic pain in female 10/13/2011    Past Surgical History:  Procedure Laterality Date  . CESAREAN SECTION    . DILATION AND CURETTAGE OF UTERUS     for heavy periods     OB History    Gravida  2   Para  2   Term  2   Preterm      AB      Living  2     SAB      TAB      Ectopic      Multiple      Live Births               Home Medications    Prior to Admission medications   Medication Sig Start Date End Date Taking? Authorizing Provider  ondansetron (ZOFRAN-ODT) 8 MG disintegrating tablet Take 1 tablet (8 mg total) by mouth every 8 (eight) hours as needed for nausea. 11/18/18   Robyn Haber, MD  albuterol (VENTOLIN HFA) 108 (90 Base) MCG/ACT inhaler Inhale 1-2 puffs into the lungs every 6 (six) hours as needed for wheezing or shortness of breath. Patient not taking: Reported on 08/24/2018 07/01/18 11/18/18  Janith Lima, PA-C    Family History Family History   Problem Relation Age of Onset  . Diabetes Mother   . Hypertension Mother   . Thyroid disease Maternal Aunt   . Uterine cancer Cousin   . Migraines Son     Social History Social History   Tobacco Use  . Smoking status: Never Smoker  . Smokeless tobacco: Never Used  Substance Use Topics  . Alcohol use: Yes    Alcohol/week: 2.0 standard drinks    Types: 1 Glasses of wine, 1 Cans of beer per week    Comment: pt reports drinking 3-4 drinks per week  . Drug use: No     Allergies   Codeine   Review of Systems Review of Systems  Gastrointestinal: Positive for abdominal pain, diarrhea and nausea.  All other systems reviewed and are negative.    Physical Exam Triage Vital Signs ED Triage Vitals  Enc Vitals Group     BP 11/18/18 1456 119/68     Pulse Rate 11/18/18 1456 88     Resp 11/18/18 1456 18     Temp 11/18/18 1456  98.3 F (36.8 C)     Temp src --      SpO2 11/18/18 1456 97 %     Weight --      Height --      Head Circumference --      Peak Flow --      Pain Score 11/18/18 1457 3     Pain Loc --      Pain Edu? --      Excl. in [place]? --    No data found.  Updated Vital Signs BP 119/68 (BP Location: Right Arm)   Pulse 88   Temp 98.3 F (36.8 C)   Resp 18   SpO2 97%    Physical Exam Vitals signs and nursing note reviewed.  Constitutional:      Appearance: She is well-developed. She is obese.  HENT:     Mouth/Throat:     Mouth: Mucous membranes are moist.  Cardiovascular:     Rate and Rhythm: Normal rate.  Pulmonary:     Effort: Pulmonary effort is normal.  Abdominal:     General: Bowel sounds are increased. There is no distension.     Tenderness: There is no abdominal tenderness.  Skin:    General: Skin is warm and dry.  Neurological:     General: No focal deficit present.     Mental Status: She is alert.  Psychiatric:        Mood and Affect: Mood normal.        Behavior: Behavior normal.      UC Treatments / Results  Labs (all labs  ordered are listed, but only abnormal results are displayed) Labs Reviewed - No data to display  EKG   Radiology No results found.  Procedures Procedures (including critical care time)  Medications Ordered in UC Medications - No data to display  Initial Impression / Assessment and Plan / UC Course  I have reviewed the triage vital signs and the nursing notes.  Pertinent labs & imaging results that were available during my care of the patient were reviewed by me and considered in my medical decision making (see chart for details).    Final Clinical Impressions(s) / UC Diagnoses   Final diagnoses:  Diarrhea, unspecified type     Discharge Instructions     Hold off on your current medications  Clear liquids and crackers only today  The COVID-19 saliva test is available at [facility] near [place]    ED Prescriptions    Medication Sig Dispense Auth. Provider   ondansetron (ZOFRAN-ODT) 8 MG disintegrating tablet Take 1 tablet (8 mg total) by mouth every 8 (eight) hours as needed for nausea. 12 tablet Robyn Haber, MD     Controlled Substance Prescriptions [place] Controlled Substance Registry consulted? Not Applicable   Robyn Haber, MD 11/18/18 1530

## 2018-12-03 ENCOUNTER — Ambulatory Visit: Payer: BC Managed Care – PPO | Admitting: Sports Medicine

## 2018-12-24 ENCOUNTER — Encounter: Payer: Self-pay | Admitting: Sports Medicine

## 2018-12-24 ENCOUNTER — Other Ambulatory Visit: Payer: Self-pay

## 2018-12-24 ENCOUNTER — Telehealth: Payer: Self-pay | Admitting: *Deleted

## 2018-12-24 ENCOUNTER — Ambulatory Visit (INDEPENDENT_AMBULATORY_CARE_PROVIDER_SITE_OTHER): Payer: BC Managed Care – PPO | Admitting: Sports Medicine

## 2018-12-24 DIAGNOSIS — M7661 Achilles tendinitis, right leg: Secondary | ICD-10-CM | POA: Diagnosis not present

## 2018-12-24 DIAGNOSIS — M19071 Primary osteoarthritis, right ankle and foot: Secondary | ICD-10-CM

## 2018-12-24 DIAGNOSIS — M722 Plantar fascial fibromatosis: Secondary | ICD-10-CM

## 2018-12-24 DIAGNOSIS — T148XXA Other injury of unspecified body region, initial encounter: Secondary | ICD-10-CM

## 2018-12-24 DIAGNOSIS — M19072 Primary osteoarthritis, left ankle and foot: Secondary | ICD-10-CM

## 2018-12-24 DIAGNOSIS — M79671 Pain in right foot: Secondary | ICD-10-CM | POA: Diagnosis not present

## 2018-12-24 MED ORDER — PREDNISONE 10 MG (21) PO TBPK: ORAL_TABLET | ORAL | 0 refills | Status: DC

## 2018-12-24 MED ORDER — MELOXICAM 15 MG PO TABS: 15.0000 mg | ORAL_TABLET | Freq: Every day | ORAL | 0 refills | Status: DC

## 2018-12-24 NOTE — Progress Notes (Signed)
Subjective: Tracy Rosales is a 43 y.o. female patient who returns to office for follow up eval of R>L foot/heel pain. Reports that pain is worse at the back of the right heel.  Patient denies any recurrent pain at the bottoms of the heels and states that the pain feels like it is radiating up into her legs and reports that after being on her feet at work the pain is very severe.  Patient reports that previous shot of medicine using boot heel lifts splint and stretching has not helped.  Patient noticed a little bit of relief when she was taking meloxicam.  Patient denies any other symptoms like increased redness warmth swelling or any other signs of infection or problems at this time.  Patient denies any new trauma or injury.  Patient denies any changes with work responsibilities at this time.  Patient denies any other pedal complaints or symptoms at this time.   Patient Active Problem List   Diagnosis Date Noted  . Obesity (BMI 30-39.9) 05/30/2017  . Family history of diabetes mellitus in mother 05/30/2017  . History of PID 05/30/2017  . Endometriosis 05/30/2017  . Fibroids 05/30/2017  . Female dyspareunia 05/30/2017  . Mild intermittent asthma 05/30/2017  . BV (bacterial vaginosis) 05/30/2017  . Pelvic pain in female 10/13/2011    Current Outpatient Medications on File Prior to Visit  Medication Sig Dispense Refill  . ondansetron (ZOFRAN-ODT) 8 MG disintegrating tablet Take 1 tablet (8 mg total) by mouth every 8 (eight) hours as needed for nausea. 12 tablet 0  . [DISCONTINUED] albuterol (VENTOLIN HFA) 108 (90 Base) MCG/ACT inhaler Inhale 1-2 puffs into the lungs every 6 (six) hours as needed for wheezing or shortness of breath. (Patient not taking: Reported on 08/24/2018) 1 Inhaler 0   No current facility-administered medications on file prior to visit.     Allergies  Allergen Reactions  . Codeine Other (See Comments)    unknown    Objective:  General: Alert and oriented x3 in no acute  distress  Dermatology: No open lesions bilateral lower extremities, no webspace macerations, no ecchymosis bilateral, all nails x 10 are well manicured.  Vascular: Dorsalis Pedis and Posterior Tibial pedal pulses 1/4, Capillary Fill Time 3 seconds, + pedal hair growth bilateral, no edema bilateral lower extremities, Temperature gradient within normal limits.  Neurology: Johney Maine sensation intact via light touch bilateral.   Musculoskeletal: Moderate tenderness with palpation at insertion of the Achilles on Right>Left at lateral insertion, no reproducible tenderness at plantar fascial bilateral but there is pain to the dorsum of the left foot consistent with arthritis with dorsal exostosis.  There is calcaneal exostosis with mild soft tissue present and decreased ankle rom with knee extending  vs flexed resembling gastroc equnius bilateral, The achilles tendon feels intact with no nodularity or palpable dell, Thompson sign negative, Subtalar and midtarsal joint range of motion is within normal limits, there is no 1st ray hypermobility or forefoot deformity noted bilateral.   Assessment and Plan: Problem List Items Addressed This Visit    None    Visit Diagnoses    Tendonitis, Achilles, right    -  Primary   Inflammatory pain of right heel       Plantar fasciitis, bilateral         -Complete examination performed -Re-Discussed treatment options -Patient elects at this time for [place] to proceed with MRI to discover if patient will need surgery versus a course of physical therapy depending on results of MRI  since pain had not improved after several months of conservative care and pain even prior to her seeing me had been going on for many years without improvement now chronic in nature need to rule out acute on chronic issues or tear along the Achilles tendon on right -Refilled meloxicam to take as instructed and advised patient to avoid from taking excessive amounts of this medication and to avoid use  of any other NSAIDs while taking meloxicam -Continue with bilateral night splints and gentle stretching a combination of heat and ice and topical pain creams and rubs as needed -Patient to return to office after MRI on right or sooner if condition worsens.  Landis Martins, DPM

## 2018-12-24 NOTE — Telephone Encounter (Signed)
Faxed orders to [facility], Gretta Arab, RN.

## 2018-12-24 NOTE — Telephone Encounter (Signed)
-----   Message from Landis Martins, [place] sent at 12/24/2018  2:24 PM EDT ----- Regarding: MRI R ankle Chronic achilles pain r/o tear

## 2019-01-06 ENCOUNTER — Ambulatory Visit
Admission: RE | Admit: 2019-01-06 | Discharge: 2019-01-06 | Disposition: A | Discharge status: Home / Self Care | Payer: BC Managed Care – PPO | Source: Ambulatory Visit | Attending: Sports Medicine | Admitting: Sports Medicine

## 2019-01-06 ENCOUNTER — Other Ambulatory Visit: Payer: Self-pay

## 2019-01-06 ENCOUNTER — Other Ambulatory Visit: Payer: BC Managed Care – PPO

## 2019-01-06 DIAGNOSIS — T148XXA Other injury of unspecified body region, initial encounter: Secondary | ICD-10-CM

## 2019-01-06 DIAGNOSIS — M7661 Achilles tendinitis, right leg: Secondary | ICD-10-CM

## 2019-01-21 ENCOUNTER — Ambulatory Visit (INDEPENDENT_AMBULATORY_CARE_PROVIDER_SITE_OTHER): Payer: BC Managed Care – PPO | Admitting: Sports Medicine

## 2019-01-21 ENCOUNTER — Other Ambulatory Visit: Payer: Self-pay

## 2019-01-21 ENCOUNTER — Encounter: Payer: Self-pay | Admitting: Sports Medicine

## 2019-01-21 DIAGNOSIS — M19071 Primary osteoarthritis, right ankle and foot: Secondary | ICD-10-CM | POA: Diagnosis not present

## 2019-01-21 DIAGNOSIS — M7661 Achilles tendinitis, right leg: Secondary | ICD-10-CM | POA: Diagnosis not present

## 2019-01-21 DIAGNOSIS — M79671 Pain in right foot: Secondary | ICD-10-CM | POA: Diagnosis not present

## 2019-01-21 DIAGNOSIS — T148XXA Other injury of unspecified body region, initial encounter: Secondary | ICD-10-CM

## 2019-01-21 NOTE — Patient Instructions (Signed)
Pre-Operative Instructions  Congratulations, you have decided to take an important step towards improving your quality of life.  You can be assured that the doctors and staff at [facility] will be with you every step of the way.  Here are some important things you should know:  1. Plan to be at the surgery center/hospital at least 1 (one) hour prior to your scheduled time, unless otherwise directed by the surgical center/hospital staff.  You must have a responsible adult accompany you, remain during the surgery and drive you home.  Make sure you have directions to the surgical center/hospital to ensure you arrive on time. 2. If you are having surgery at [facility] or [facility] hospitals, you will need a copy of your medical history and physical form from your family physician within one month prior to the date of surgery. We will give you a form for your primary physician to complete.  3. We make every effort to accommodate the date you request for surgery.  However, there are times where surgery dates or times have to be moved.  We will contact you as soon as possible if a change in schedule is required.   4. No aspirin/ibuprofen for one week before surgery.  If you are on aspirin, any non-steroidal anti-inflammatory medications (Mobic, Aleve, Ibuprofen) should not be taken seven (7) days prior to your surgery.  You make take Tylenol for pain prior to surgery.  5. Medications - If you are taking daily heart and blood pressure medications, seizure, reflux, allergy, asthma, anxiety, pain or diabetes medications, make sure you notify the surgery center/hospital before the day of surgery so they can tell you which medications you should take or avoid the day of surgery. 6. No food or drink after midnight the night before surgery unless directed otherwise by surgical center/hospital staff. 7. No alcoholic beverages 24-hours prior to surgery.  No smoking 24-hours prior or 24-hours after  surgery. 8. Wear loose pants or shorts. They should be loose enough to fit over bandages, boots, and casts. 9. Don't wear slip-on shoes. Sneakers are preferred. 10. Bring your boot with you to the surgery center/hospital.  Also bring crutches or a walker if your physician has prescribed it for you.  If you do not have this equipment, it will be provided for you after surgery. 11. If you have not been contacted by the surgery center/hospital by the day before your surgery, call to confirm the date and time of your surgery. 12. Leave-time from work may vary depending on the type of surgery you have.  Appropriate arrangements should be made prior to surgery with your employer. 13. Prescriptions will be provided immediately following surgery by your doctor.  Fill these as soon as possible after surgery and take the medication as directed. Pain medications will not be refilled on weekends and must be approved by the doctor. 14. Remove nail polish on the operative foot and avoid getting pedicures prior to surgery. 15. Wash the night before surgery.  The night before surgery wash the foot and leg well with water and the antibacterial soap provided. Be sure to pay special attention to beneath the toenails and in between the toes.  Wash for at least three (3) minutes. Rinse thoroughly with water and dry well with a towel.  Perform this wash unless told not to do so by your physician.  Enclosed: 1 Ice pack (please put in freezer the night before surgery)   1 Hibiclens skin cleaner     Pre-op instructions  If you have any questions regarding the instructions, please do not hesitate to call our office.  [place]: [address], [place], [place] [address] -- [phone]  [place]: [address] [place], [place] [address] -- [phone]  [place]: [address]  ,  [address] -- [phone]   Website: [email or web address] 

## 2019-01-21 NOTE — Progress Notes (Signed)
Subjective: Tracy Rosales is a 43 y.o. female patient who returns to office for follow up eval of R>L foot/heel pain. Reports that pain is about the same. Reports pain is worse at her back of her right heel. States that she went for her MRI and is here for results.  Reports that her mobic and prednisone made her head hurt so she stopped the medication. 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 Patient denies any other pedal complaints or symptoms at this time.   Patient Active Problem List   Diagnosis Date Noted  . Obesity (BMI 30-39.9) 05/30/2017  . Family history of diabetes mellitus in mother 05/30/2017  . History of PID 05/30/2017  . Endometriosis 05/30/2017  . Fibroids 05/30/2017  . Female dyspareunia 05/30/2017  . Mild intermittent asthma 05/30/2017  . BV (bacterial vaginosis) 05/30/2017  . Pelvic pain in female 10/13/2011    Current Outpatient Medications on File Prior to Visit  Medication Sig Dispense Refill  . meloxicam (MOBIC) 15 MG tablet Take 1 tablet (15 mg total) by mouth daily. 30 tablet 0  . predniSONE (STERAPRED UNI-PAK 21 TAB) 10 MG (21) TBPK tablet Take as directed 21 tablet 0  . [DISCONTINUED] albuterol (VENTOLIN HFA) 108 (90 Base) MCG/ACT inhaler Inhale 1-2 puffs into the lungs every 6 (six) hours as needed for wheezing or shortness of breath. (Patient not taking: Reported on 08/24/2018) 1 Inhaler 0   No current facility-administered medications on file prior to visit.     Allergies  Allergen Reactions  . Codeine Other (See Comments)    unknown   Family History  Problem Relation Age of Onset  . Diabetes Mother   . Hypertension Mother   . Thyroid disease Maternal Aunt   . Uterine cancer Cousin   . Migraines Son     Social History   Socioeconomic History  . Marital status: Legally Separated    Spouse name: Not on file  . Number of children: Not on file  . Years of education:  Not on file  . Highest education level: Not on file  Occupational History  . Not on file  Social Needs  . Financial resource strain: Not on file  . Food insecurity    Worry: Not on file    Inability: Not on file  . Transportation needs    Medical: Not on file    Non-medical: Not on file  Tobacco Use  . Smoking status: Never Smoker  . Smokeless tobacco: Never Used  Substance and Sexual Activity  . Alcohol use: Yes    Alcohol/week: 2.0 standard drinks    Types: 1 Glasses of wine, 1 Cans of beer per week    Comment: pt reports drinking 3-4 drinks per week  . Drug use: No  . Sexual activity: Yes    Partners: Male    Birth control/protection: None  Lifestyle  . Physical activity    Days per week: Not on file    Minutes per session: Not on file  . Stress: Not on file  Relationships  . Social Herbalist on phone: Not on file    Gets together: Not on file    Attends religious service: Not on file    Active member of club or organization: Not on file    Attends meetings of clubs or organizations: Not on file    Relationship status: Not on  file  Other Topics Concern  . Not on file  Social History Narrative  . Not on file   Past Surgical History:  Procedure Laterality Date  . CESAREAN SECTION    . DILATION AND CURETTAGE OF UTERUS     for heavy periods     Objective:  General: Alert and oriented x3 in no acute distress  Dermatology: No open lesions bilateral lower extremities, no webspace macerations, no ecchymosis bilateral, all nails x 10 are well manicured.  Vascular: Dorsalis Pedis and Posterior Tibial pedal pulses 1/4, Capillary Fill Time 3 seconds, + pedal hair growth bilateral, no edema bilateral lower extremities, Temperature gradient within normal limits.  Neurology: Johney Maine sensation intact via light touch bilateral.   Musculoskeletal: Moderate tenderness with palpation at insertion of the Achilles on Right>Left at lateral insertion, no reproducible  tenderness at plantar fascial bilateral but there is pain to the dorsum of the left foot consistent with arthritis with dorsal exostosis.  There is calcaneal exostosis with mild soft tissue present and decreased ankle rom with knee extending  vs flexed resembling gastroc equnius bilateral, The achilles tendon feels intact with no nodularity or palpable dell, Thompson sign negative, Subtalar and midtarsal joint range of motion is within normal limits, there is no 1st ray hypermobility or forefoot deformity noted bilateral. + Pes planus.   Assessment and Plan: Problem List Items Addressed This Visit    None    Visit Diagnoses    Tendonitis, Achilles, right    -  Primary   Tendon tear       Inflammatory pain of right heel       Arthritis of right foot       Right foot pain         -Complete examination performed -MRI results reviewed -Discuss treatment options --Patient opt for surgical management. Consent obtained for Right ahilles tendon repair and removal of heel spur with splint. Pre and Post op course explained. Risks, benefits, alternatives explained. No guarantees given or implied. Surgical booking slip submitted and provided patient with Surgical packet and info for [facility].  -Office to order knee scooter  -Continue with bilateral night splints and gentle stretching a combination of heat and ice and topical pain creams and rubs as needed until time for surgery  -Patient to return to office after surgery or sooner if problems arise  Landis Martins, DPM

## 2019-01-28 ENCOUNTER — Ambulatory Visit: Payer: BC Managed Care – PPO | Admitting: Sports Medicine

## 2019-03-10 ENCOUNTER — Telehealth: Payer: Self-pay | Admitting: *Deleted

## 2019-03-10 NOTE — Telephone Encounter (Signed)
I am returning your call.  You want to schedule your surgery?  "Yes, I do."  Dr. Cannon Kettle does surgeries on Mondays.  Do you have a date that you like?  "I'd like to do it in August.  Is that too late to schedule for now?"  I'll get it scheduled but you will need to see Dr. Cannon Kettle again for another consultation.  Would you like me to transfer you to a scheduler so you schedule that appointment?  "Yes, that will be fine."  I transferred her to [place].

## 2019-03-10 NOTE — Telephone Encounter (Signed)
"  I'm calling to schedule my foot surgery.  Could you give me a call back?"

## 2019-03-25 ENCOUNTER — Other Ambulatory Visit: Payer: Self-pay

## 2019-03-25 ENCOUNTER — Ambulatory Visit (INDEPENDENT_AMBULATORY_CARE_PROVIDER_SITE_OTHER): Payer: BC Managed Care – PPO | Admitting: Sports Medicine

## 2019-03-25 ENCOUNTER — Encounter: Payer: Self-pay | Admitting: Sports Medicine

## 2019-03-25 ENCOUNTER — Other Ambulatory Visit: Payer: Self-pay | Admitting: Sports Medicine

## 2019-03-25 VITALS — Temp 97.5°F

## 2019-03-25 DIAGNOSIS — M7661 Achilles tendinitis, right leg: Secondary | ICD-10-CM

## 2019-03-25 DIAGNOSIS — M79671 Pain in right foot: Secondary | ICD-10-CM | POA: Diagnosis not present

## 2019-03-25 MED ORDER — DICLOFENAC SODIUM 75 MG PO TBEC: 75.0000 mg | DELAYED_RELEASE_TABLET | Freq: Two times a day (BID) | ORAL | 0 refills | Status: DC

## 2019-03-25 MED ORDER — CYCLOBENZAPRINE HCL 10 MG PO TABS: 10.0000 mg | ORAL_TABLET | Freq: Three times a day (TID) | ORAL | 0 refills | Status: DC | PRN

## 2019-03-25 MED ORDER — METHYLPREDNISOLONE 4 MG PO TBPK: ORAL_TABLET | ORAL | 0 refills | Status: DC

## 2019-03-25 NOTE — Progress Notes (Signed)
Sent Muscle relaxant

## 2019-03-25 NOTE — Progress Notes (Signed)
Subjective: Tracy Rosales is a 44 y.o. female patient who returns to office for follow up eval of R>L foot/heel pain. Reports that pain has flared up and seems like the more she stands and walks with that at work the pain is worse. Reports pain is worse at her back of her right heel feels like she is having a flareup and does not want to wait until August to get her surgery done wants to get her surgery done sooner.  Patient denies any other pedal complaints or symptoms at this time.  No other issues noted.   Patient Active Problem List   Diagnosis Date Noted  . Obesity (BMI 30-39.9) 05/30/2017  . Family history of diabetes mellitus in mother 05/30/2017  . History of PID 05/30/2017  . Endometriosis 05/30/2017  . Fibroids 05/30/2017  . Female dyspareunia 05/30/2017  . Mild intermittent asthma 05/30/2017  . BV (bacterial vaginosis) 05/30/2017  . Pelvic pain in female 10/13/2011    Current Outpatient Medications on File Prior to Visit  Medication Sig Dispense Refill  . [DISCONTINUED] albuterol (VENTOLIN HFA) 108 (90 Base) MCG/ACT inhaler Inhale 1-2 puffs into the lungs every 6 (six) hours as needed for wheezing or shortness of breath. (Patient not taking: Reported on 08/24/2018) 1 Inhaler 0   No current facility-administered medications on file prior to visit.    Allergies  Allergen Reactions  . Codeine Other (See Comments)    unknown  . Meloxicam     Pt stated, "This gave me a headache"  . Prednisone     Pt stated, "This gave me a headache"   Family History  Problem Relation Age of Onset  . Diabetes Mother   . Hypertension Mother   . Thyroid disease Maternal Aunt   . Uterine cancer Cousin   . Migraines Son     Social History   Socioeconomic History  . Marital status: Legally Separated    Spouse name: Not on file  . Number of children: Not on file  . Years of education: Not on file  . Highest education level: Not on file  Occupational History  . Not on file  Tobacco Use   . Smoking status: Never Smoker  . Smokeless tobacco: Never Used  Substance and Sexual Activity  . Alcohol use: Yes    Alcohol/week: 2.0 standard drinks    Types: 1 Glasses of wine, 1 Cans of beer per week    Comment: pt reports drinking 3-4 drinks per week  . Drug use: No  . Sexual activity: Yes    Partners: Male    Birth control/protection: None  Other Topics Concern  . Not on file  Social History Narrative  . Not on file   Social Determinants of Health   Financial Resource Strain:   . Difficulty of Paying Living Expenses: Not on file  Food Insecurity:   . Worried About Charity fundraiser in the Last Year: Not on file  . Ran Out of Food in the Last Year: Not on file  Transportation Needs:   . Lack of Transportation (Medical): Not on file  . Lack of Transportation (Non-Medical): Not on file  Physical Activity:   . Days of Exercise per Week: Not on file  . Minutes of Exercise per Session: Not on file  Stress:   . Feeling of Stress : Not on file  Social Connections:   . Frequency of Communication with Friends and Family: Not on file  . Frequency of Social Gatherings with Friends  and Family: Not on file  . Attends Religious Services: Not on file  . Active Member of Clubs or Organizations: Not on file  . Attends Archivist Meetings: Not on file  . Marital Status: Not on file   Past Surgical History:  Procedure Laterality Date  . CESAREAN SECTION    . DILATION AND CURETTAGE OF UTERUS     for heavy periods     Objective:  General: Alert and oriented x3 in no acute distress  Dermatology: No open lesions bilateral lower extremities, no webspace macerations, no ecchymosis bilateral, all nails x 10 are well manicured.  Vascular: Dorsalis Pedis and Posterior Tibial pedal pulses 1/4, Capillary Fill Time 3 seconds, + pedal hair growth bilateral, no edema bilateral lower extremities, Temperature gradient within normal limits.  Neurology: Johney Maine sensation intact via  light touch bilateral.   Musculoskeletal: Mild to Moderate tenderness with palpation at insertion of the Achilles on Right>Left at lateral insertion, no reproducible tenderness at plantar fascial bilateral but there is pain to the dorsum of the left foot consistent with arthritis with dorsal exostosis.  There is calcaneal exostosis with mild soft tissue present and decreased ankle rom with knee extending  vs flexed resembling gastroc equnius bilateral, The achilles tendon feels intact with no nodularity or palpable dell, Thompson sign negative, Subtalar and midtarsal joint range of motion is within normal limits, there is no 1st ray hypermobility or forefoot deformity noted bilateral. + Pes planus.   Assessment and Plan: Problem List Items Addressed This Visit    None    Visit Diagnoses    Tendonitis, Achilles, right    -  Primary   Inflammatory pain of right heel       Right foot pain         -Complete examination performed -Discuss treatment options -Patient to discuss with Delydia or Janett Billow re: possibly moving her date of surgery to April -Continue with bilateral night splints and gentle stretching a combination of heat and ice and topical pain creams and rubs as needed until time for surgery  -Dispensed heel lifts bilateral -Advised patient that she will need at least 3 months or 12 weeks out of work and advised patient to get her FMLA or disability paperwork sent to our office -Prescribed Medrol Dosepak and diclofenac to see if this will help with her pain and symptoms until time for surgery -Patient to return to office after surgery or sooner if problems arise  Landis Martins, DPM

## 2019-03-31 ENCOUNTER — Telehealth: Payer: Self-pay | Admitting: Sports Medicine

## 2019-03-31 NOTE — Telephone Encounter (Signed)
We can no do intermittent FMLA because this will take away from the time that is allotted for her surgical recovery. FMLA only gives her 12 weeks and because her surgery will involve the achilles she may need to use all of this time to recover -Dr. Cannon Kettle

## 2019-03-31 NOTE — Telephone Encounter (Signed)
Pt . Would like intermittent FMLA until she has her surgery

## 2019-04-01 ENCOUNTER — Telehealth: Payer: Self-pay | Admitting: *Deleted

## 2019-04-01 NOTE — Telephone Encounter (Signed)
I am calling you per Dr. Cannon Kettle to reschedule your surgery date.  "Dr. Cannon Kettle is getting it confused.  I am getting points on my job when I have to leave work for appointments and when I have flare ups and have to be out of work.  The FMLA is supposed to be intermittent so I can be out of work when I need it due to the flare ups." I will let Dr. Cannon Kettle know and see what she says.  "So, if I send the FMLA paperwork over, who do I send it to?"  Kristopher Glee takes care of FMLA.

## 2019-04-16 ENCOUNTER — Telehealth: Payer: Self-pay | Admitting: Sports Medicine

## 2019-04-16 NOTE — Telephone Encounter (Signed)
Patient called really upset about the intermittent FMLA. She said if she loses her job, then she wont be having the surgery anyway. She wants you to call her, so I told her I would pass the message.

## 2019-04-17 NOTE — Telephone Encounter (Signed)
FYI I called patient to discuss FMLA concerns. The phone call went to an automated message stating "unavailable please try your call again later".  -Dr. Cannon Kettle

## 2019-04-25 ENCOUNTER — Encounter: Payer: Self-pay | Admitting: Sports Medicine

## 2019-04-29 ENCOUNTER — Encounter (HOSPITAL_BASED_OUTPATIENT_CLINIC_OR_DEPARTMENT_OTHER): Admission: RE | Payer: Self-pay | Source: Home / Self Care

## 2019-04-29 ENCOUNTER — Ambulatory Visit (HOSPITAL_BASED_OUTPATIENT_CLINIC_OR_DEPARTMENT_OTHER)
Admission: RE | Admit: 2019-04-29 | Payer: BC Managed Care – PPO | Source: Home / Self Care | Admitting: Obstetrics & Gynecology

## 2019-04-29 SURGERY — DILATATION AND CURETTAGE /HYSTEROSCOPY
Anesthesia: Choice

## 2019-06-02 ENCOUNTER — Telehealth: Payer: Self-pay | Admitting: Sports Medicine

## 2019-06-02 NOTE — Telephone Encounter (Signed)
Patient was let go from her job because she took off too much time. Patient has intermittent FMLA with 2 days off per month, but she went over the amount of time. She wants you to re-do the paperwork, saying that she may need more days off a month due to pain. Patient wants to speak with you and tell you her situation. She also wants to move her surgery date up.

## 2019-06-02 NOTE — Telephone Encounter (Signed)
Called and No answer  I can not re-do paperwork until she has a new job and most jobs require you to work there for several months before you can re-qualify for FMLA  If she wants to move up surgery date she needs to speak to [facility] and if her surgery papers from before have expired she may have to make another appointment with me to further discuss and re-sign surgery papers.   -Dr. Cannon Kettle

## 2019-06-02 NOTE — Telephone Encounter (Signed)
Pt called wanting to speak with you regarding her STD/FMLA paperwork. Wants to discuss how it can be fixed to have points from work taken off. Pt is moving her sx up from 05/24 to 05/10. Pt would like a call back as soon as possible regarding her paperwork. I will inform Shelly of pt's new sx date as well.

## 2019-06-03 ENCOUNTER — Telehealth: Payer: Self-pay | Admitting: Sports Medicine

## 2019-06-03 NOTE — Telephone Encounter (Signed)
Left message for Tracy Rosales at [employer] [facility] [employer] that patient has requested to move surgery from 07/28/19 to 07/14/19

## 2019-06-03 NOTE — Telephone Encounter (Signed)
Discussed thoroughly with patient FMLA.  Patient reports that she lost her job on Sunday due to missing 6 days of work this month.  Patient was under intermittent FMLA where are allowed 2 days/month for flareups I explained to patient that even if I do and admit her intermittent FMLA because she has been fired there is no guarantee that they would reinstate her at her job.  I did write a note on a letterhead asking her job to please excuse her for the additional days that she missed for the month of March and to make sure patient has the proper follow-up when she has these flareups to avoid injury at work.  Patient reports that she will stop by the office on tomorrow to pick up letter and to drop off any additional paperwork that needs to be filled out on her behalf.  Patient reports that she is still planning to have surgery in May.  Patient also reports that she will talk with her manager and supervisor at work to see if they can help with reinstating her job as she feels as if she has been fired unfairly. -Dr Chauncey Cruel

## 2019-06-09 ENCOUNTER — Telehealth: Payer: Self-pay

## 2019-06-09 NOTE — Telephone Encounter (Signed)
Received email from [place] at [facility] stating the Pt no longer has ins (no longer with that employer). She needs to postpone sx. I left a message for Km. to see if she has a date in mind that she would like to reschedule to.

## 2019-06-20 ENCOUNTER — Encounter (HOSPITAL_COMMUNITY): Payer: Self-pay

## 2019-06-20 ENCOUNTER — Other Ambulatory Visit: Payer: Self-pay

## 2019-06-20 ENCOUNTER — Ambulatory Visit (HOSPITAL_COMMUNITY)
Admission: EM | Admit: 2019-06-20 | Discharge: 2019-06-20 | Disposition: A | Discharge status: Home / Self Care | Payer: HRSA Program | Attending: Internal Medicine | Admitting: Internal Medicine

## 2019-06-20 DIAGNOSIS — Z1152 Encounter for screening for COVID-19: Secondary | ICD-10-CM

## 2019-06-20 DIAGNOSIS — H109 Unspecified conjunctivitis: Secondary | ICD-10-CM | POA: Diagnosis present

## 2019-06-20 DIAGNOSIS — Z20822 Contact with and (suspected) exposure to covid-19: Secondary | ICD-10-CM | POA: Diagnosis present

## 2019-06-20 DIAGNOSIS — B349 Viral infection, unspecified: Secondary | ICD-10-CM | POA: Insufficient documentation

## 2019-06-20 DIAGNOSIS — U071 COVID-19: Secondary | ICD-10-CM | POA: Diagnosis not present

## 2019-06-20 MED ORDER — PSEUDOEPH-BROMPHEN-DM 30-2-10 MG/5ML PO SYRP: 5.0000 mL | ORAL_SOLUTION | Freq: Four times a day (QID) | ORAL | 0 refills | Status: DC | PRN

## 2019-06-20 MED ORDER — POLYMYXIN B-TRIMETHOPRIM 10000-0.1 UNIT/ML-% OP SOLN: 2.0000 [drp] | Freq: Three times a day (TID) | OPHTHALMIC | 0 refills | Status: AC

## 2019-06-20 NOTE — ED Notes (Signed)
Pt refused COVID swab

## 2019-06-20 NOTE — ED Provider Notes (Signed)
[facility]    CSN: [id number] Arrival date & time: 06/20/19  1353      History   Chief Complaint Chief Complaint  Patient presents with  . covid symptoms    HPI Tracy Rosales is a 44 y.o. female.   HPI  Patient presents for evaluation of symptoms of headache, eye irritation, nasal congestion, and cough. Denies any known exposure to anyone for positive for COVID-19. Works outside of the home. Patient suffers from asthma. She endorses SOB without wheezing, non-productive cough, nasal congestion, severe eye itching and burning of eyes. Denies respiratory distress. She has taken OTC medication without improvement of symptoms. Past Medical History:  Diagnosis Date  . Abnormal uterine bleeding   . Asthma   . Bronchitis   . Depression   . Endometriosis   . Headache(784.0)   . Obesity   . Uterine fibroid   . Vaginal Pap smear, abnormal    2017    Patient Active Problem List   Diagnosis Date Noted  . Obesity (BMI 30-39.9) 05/30/2017  . Family history of diabetes mellitus in mother 05/30/2017  . History of PID 05/30/2017  . Endometriosis 05/30/2017  . Fibroids 05/30/2017  . Female dyspareunia 05/30/2017  . Mild intermittent asthma 05/30/2017  . BV (bacterial vaginosis) 05/30/2017  . Pelvic pain in female 10/13/2011    Past Surgical History:  Procedure Laterality Date  . CESAREAN SECTION    . DILATION AND CURETTAGE OF UTERUS     for heavy periods     OB History    Gravida  2   Para  2   Term  2   Preterm      AB      Living  2     SAB      TAB      Ectopic      Multiple      Live Births               Home Medications    Prior to Admission medications   Medication Sig Start Date End Date Taking? Authorizing Provider  cyclobenzaprine (FLEXERIL) 10 MG tablet Take 1 tablet (10 mg total) by mouth 3 (three) times daily as needed for muscle spasms. 03/25/19   Landis Martins, DPM  diclofenac (VOLTAREN) 75 MG EC tablet Take 1 tablet (75  mg total) by mouth 2 (two) times daily. 03/25/19   Landis Martins, DPM  methylPREDNISolone (MEDROL DOSEPAK) 4 MG TBPK tablet Take as directed 03/25/19   Landis Martins, DPM  albuterol (VENTOLIN HFA) 108 (90 Base) MCG/ACT inhaler Inhale 1-2 puffs into the lungs every 6 (six) hours as needed for wheezing or shortness of breath. Patient not taking: Reported on 08/24/2018 07/01/18 11/18/18  Janith Lima, PA-C    Family History Family History  Problem Relation Age of Onset  . Diabetes Mother   . Hypertension Mother   . Thyroid disease Maternal Aunt   . Uterine cancer Cousin   . Migraines Son     Social History Social History   Tobacco Use  . Smoking status: Never Smoker  . Smokeless tobacco: Never Used  Substance Use Topics  . Alcohol use: Yes    Alcohol/week: 2.0 standard drinks    Types: 1 Glasses of wine, 1 Cans of beer per week    Comment: pt reports drinking 3-4 drinks per week  . Drug use: No     Allergies   Codeine Review of Systems Review of Systems Pertinent negatives listed  in HPI Physical Exam Triage Vital Signs ED Triage Vitals [06/20/19 1444]  Enc Vitals Group     BP 124/68     Pulse Rate 100     Resp 18     Temp 99.8 F (37.7 C)     Temp Source Oral     SpO2 98 %     Weight 270 lb (122.5 kg)     Height 5\' 5"  (1.651 m)     Head Circumference      Peak Flow      Pain Score 9     Pain Loc      Pain Edu?      Excl. in [place]?    No data found.  Updated Vital Signs BP 124/68   Pulse 100   Temp 99.8 F (37.7 C) (Oral)   Resp 18   Ht 5\' 5"  (1.651 m)   Wt 270 lb (122.5 kg)   SpO2 98%   BMI 44.93 kg/m   Visual Acuity Right Eye Distance:   Left Eye Distance:   Bilateral Distance:    Right Eye Near:   Left Eye Near:    Bilateral Near:     Physical Exam Constitutional:      Appearance: She is obese. She is ill-appearing.  HENT:     Right Ear: External ear normal.     Left Ear: External ear normal.     Nose: Congestion and rhinorrhea  present.     Mouth/Throat:     Mouth: Mucous membranes are moist.     Pharynx: Posterior oropharyngeal erythema present. No oropharyngeal exudate.  Eyes:     General: Lids are normal.     Conjunctiva/sclera:     Right eye: Hemorrhage present.     Left eye: Hemorrhage present.  Pulmonary:     Effort: Pulmonary effort is normal.     Breath sounds: Normal breath sounds.  Skin:    General: Skin is warm and dry.  Psychiatric:        Attention and Perception: Attention normal.        Mood and Affect: Mood normal.        Speech: Speech normal.        Behavior: Behavior normal.    UC Treatments / Results  Labs (all labs ordered are listed, but only abnormal results are displayed) Labs Reviewed  SARS CORONAVIRUS 2 (TAT 6-24 HRS)    EKG   Radiology No results found.  Procedures Procedures (including critical care time)  Medications Ordered in UC Medications - No data to display  Initial Impression / Assessment and Plan / UC Course  I have reviewed the triage vital signs and the nursing notes.  Pertinent labs & imaging results that were available during my care of the patient were reviewed by me and considered in my medical decision making (see chart for details).     Encounter for screening for COVID-19  COVID-19 test pending.  Patient encouraged to self isolate while test is pending.  Viral illness Conjunctivitis of both eyes, unspecified conjunctivitis type Recommendation management of viral illness include: Vitamin D 5,000 IU daily, Vitamin C 500 mg twice daily,Zinc 50 mg daily For eyes, trial Polytrim 2 drops both eyes TID. For cough and congestion, Bromfed 5 mls every 4 hours PRN   Final Clinical Impressions(s) / UC Diagnoses   Final diagnoses:  Encounter for screening for COVID-19  Viral illness  Conjunctivitis of both eyes, unspecified conjunctivitis type     Discharge  Instructions     If the Polytrim is too expensive, purchase of over the counter  patanol eye drops for eye irritation. Recommendation management of viral illness include: Vitamin D 5,000 IU daily,  Vitamin C 500 mg twice daily, Zinc 50 mg daily  I have sent Bromfed for cough management.     ED Prescriptions    Medication Sig Dispense Auth. Provider   brompheniramine-pseudoephedrine-DM 30-2-10 MG/5ML syrup Take 5 mLs by mouth 4 (four) times daily as needed. 120 mL Scot Jun, FNP   trimethoprim-polymyxin b (POLYTRIM) ophthalmic solution Place 2 drops into both eyes in the morning, at noon, and at bedtime. 10 mL Scot Jun, FNP     PDMP not reviewed this encounter.   Scot Jun, FNP 06/21/19 2226

## 2019-06-20 NOTE — Discharge Instructions (Signed)
If the Polytrim is too expensive, purchase of over the counter patanol eye drops for eye irritation. Recommendation management of viral illness include: Vitamin D 5,000 IU daily,  Vitamin C 500 mg twice daily, Zinc 50 mg daily  I have sent Bromfed for cough management.

## 2019-06-20 NOTE — ED Triage Notes (Signed)
Pt c/o HA, body aches, sore throat, vomiting, weakness, SOB, dry coughx4 days.

## 2019-06-21 ENCOUNTER — Telehealth: Payer: Self-pay

## 2019-06-21 LAB — SARS CORONAVIRUS 2 (TAT 6-24 HRS): SARS Coronavirus 2: POSITIVE — AB

## 2019-06-21 NOTE — Telephone Encounter (Signed)
Pt notified of positive COVID-19 test results. Pt verbalized understanding. Pt reports that they are feeling feeling better than yesterday.Pt advised to remain in self quarantine until at least 10 days since symptom onset And 3 consecutive days fever free without antipyretics And improvement in respiratory symptoms. Patient advised to utilize over the counter medications to treat symptoms. Pt advised to seek treatment in the ED if respiratory issues/distress develops.Pt advised they should only leave home to seek and medical care and must wear a mask in public. Pt instructed to limit contact with family members or caregivers in the home. Pt advised to practice social distancing and to continue to use good preventative care measures such has frequent hand washing, staying out of crowds and cleaning hard surfaces frequently touched in the home.Pt informed that the health department will likely follow up and may have additional recommendations. Will notify [facility] Department.

## 2019-06-22 ENCOUNTER — Telehealth: Payer: Self-pay | Admitting: Unknown Physician Specialty

## 2019-06-22 NOTE — Telephone Encounter (Signed)
Called to discuss with [name] about Covid symptoms and the use of bamlanivimab, a monoclonal antibody infusion for those with mild to moderate Covid symptoms and at a high risk of hospitalization.     Pt is qualified for this infusion at the [facility] infusion center due to co-morbid conditions and/or a member of an at-risk group, however declines infusion at this time. Symptoms tier reviewed as well as criteria for ending isolation.  Symptoms reviewed that would warrant ED/Hospital evaluation. Preventative practices reviewed. Patient verbalized understanding. Patient advised to call back if he decides that he does want to get infusion. Callback number to the infusion center given. Patient advised to go to Urgent care or ED with severe symptoms. Last date she would be eligible for infusion is 4/20    Patient Active Problem List   Diagnosis Date Noted  . Obesity (BMI 30-39.9) 05/30/2017  . Family history of diabetes mellitus in mother 05/30/2017  . History of PID 05/30/2017  . Endometriosis 05/30/2017  . Fibroids 05/30/2017  . Female dyspareunia 05/30/2017  . Mild intermittent asthma 05/30/2017  . BV (bacterial vaginosis) 05/30/2017  . Pelvic pain in female 10/13/2011

## 2019-06-23 ENCOUNTER — Emergency Department (HOSPITAL_COMMUNITY): Payer: HRSA Program

## 2019-06-23 ENCOUNTER — Other Ambulatory Visit: Payer: Self-pay

## 2019-06-23 ENCOUNTER — Emergency Department (HOSPITAL_COMMUNITY)
Admission: EM | Admit: 2019-06-23 | Discharge: 2019-06-23 | Disposition: A | Discharge status: Home / Self Care | Payer: HRSA Program | Attending: Emergency Medicine | Admitting: Emergency Medicine

## 2019-06-23 ENCOUNTER — Encounter (HOSPITAL_COMMUNITY): Payer: Self-pay | Admitting: Emergency Medicine

## 2019-06-23 DIAGNOSIS — J45901 Unspecified asthma with (acute) exacerbation: Secondary | ICD-10-CM

## 2019-06-23 DIAGNOSIS — U071 COVID-19: Secondary | ICD-10-CM | POA: Insufficient documentation

## 2019-06-23 DIAGNOSIS — Z79899 Other long term (current) drug therapy: Secondary | ICD-10-CM | POA: Diagnosis not present

## 2019-06-23 DIAGNOSIS — R0602 Shortness of breath: Secondary | ICD-10-CM | POA: Diagnosis present

## 2019-06-23 MED ORDER — PREDNISONE 50 MG PO TABS: ORAL_TABLET | ORAL | 0 refills | Status: DC

## 2019-06-23 MED ORDER — ACETAMINOPHEN 325 MG PO TABS
650.0000 mg | ORAL_TABLET | Freq: Once | ORAL | Status: AC | PRN
  Administered 2019-06-23: 650 mg via ORAL
  Filled 2019-06-23: qty 2

## 2019-06-23 MED ORDER — ACETAMINOPHEN 325 MG PO TABS
650.0000 mg | ORAL_TABLET | Freq: Once | ORAL | Status: AC
  Administered 2019-06-23: 650 mg via ORAL
  Filled 2019-06-23: qty 2

## 2019-06-23 MED ORDER — ALBUTEROL SULFATE HFA 108 (90 BASE) MCG/ACT IN AERS
4.0000 | INHALATION_SPRAY | RESPIRATORY_TRACT | Status: DC
  Administered 2019-06-23 (×2): 4 via RESPIRATORY_TRACT
  Filled 2019-06-23: qty 6.7

## 2019-06-23 MED ORDER — PREDNISONE 20 MG PO TABS
60.0000 mg | ORAL_TABLET | Freq: Once | ORAL | Status: AC
  Administered 2019-06-23: 60 mg via ORAL
  Filled 2019-06-23: qty 3

## 2019-06-23 MED ORDER — IPRATROPIUM BROMIDE HFA 17 MCG/ACT IN AERS
2.0000 | INHALATION_SPRAY | Freq: Once | RESPIRATORY_TRACT | Status: AC
  Administered 2019-06-23: 2 via RESPIRATORY_TRACT
  Filled 2019-06-23: qty 12.9

## 2019-06-23 NOTE — Discharge Instructions (Addendum)
Take 2 puffs of the albuterol inhaler every 4-6 hours as needed.  Take 2 puffs of the Atrovent inhaler every 6 hours as needed

## 2019-06-23 NOTE — ED Triage Notes (Signed)
Pt states she was diagnosed with covid on Friday- pt has asthma and has been having asthma flare with no relief with albuterol.

## 2019-06-23 NOTE — ED Provider Notes (Signed)
[facility] EMERGENCY DEPARTMENT Provider Note   CSN: [id number] Arrival date & time: 06/23/19  1234     History Chief Complaint  Patient presents with  . coivd +  . Asthma    Tracy Rosales is a 44 y.o. female.  44 year old female with history of asthma who presents short of breath.  Patient diagnosed with Covid 3 days ago and since that time is noted some increased wheezing worse at night.  Has had chest tightness worse when she coughs.  Did have some vomiting and diarrhea which is since improved.  Denies any new leg pain or swelling.  Does not use tobacco products.  Does not have an inhaler currently.  Was contacted by the healthcare system about IV monoclonal antibody which she has deferred.  Presents for further management        Past Medical History:  Diagnosis Date  . Abnormal uterine bleeding   . Asthma   . Bronchitis   . Depression   . Endometriosis   . Headache(784.0)   . Obesity   . Uterine fibroid   . Vaginal Pap smear, abnormal    2017    Patient Active Problem List   Diagnosis Date Noted  . Obesity (BMI 30-39.9) 05/30/2017  . Family history of diabetes mellitus in mother 05/30/2017  . History of PID 05/30/2017  . Endometriosis 05/30/2017  . Fibroids 05/30/2017  . Female dyspareunia 05/30/2017  . Mild intermittent asthma 05/30/2017  . BV (bacterial vaginosis) 05/30/2017  . Pelvic pain in female 10/13/2011    Past Surgical History:  Procedure Laterality Date  . CESAREAN SECTION    . DILATION AND CURETTAGE OF UTERUS     for heavy periods      OB History    Gravida  2   Para  2   Term  2   Preterm      AB      Living  2     SAB      TAB      Ectopic      Multiple      Live Births              Family History  Problem Relation Age of Onset  . Diabetes Mother   . Hypertension Mother   . Thyroid disease Maternal Aunt   . Uterine cancer Cousin   . Migraines Son     Social History   Tobacco Use  .  Smoking status: Never Smoker  . Smokeless tobacco: Never Used  Substance Use Topics  . Alcohol use: Yes    Alcohol/week: 2.0 standard drinks    Types: 1 Glasses of wine, 1 Cans of beer per week    Comment: pt reports drinking 3-4 drinks per week  . Drug use: No    Home Medications Prior to Admission medications   Medication Sig Start Date End Date Taking? Authorizing Provider  ibuprofen (ADVIL) 200 MG tablet Take 200-600 mg by mouth every 6 (six) hours as needed for fever, headache or mild pain.   Yes [provider]  methylPREDNISolone (MEDROL DOSEPAK) 4 MG TBPK tablet Take as directed Patient taking differently: Take 4-24 mg by mouth See admin instructions. Take 24 mg by mouth on day one and decrease by 4 mg daily until finished- starting on 06/21/2019 03/25/19  Yes Stover, Titorya, DPM  Pseudoeph-Doxylamine-DM-APAP (NYQUIL MULTI-SYMPTOM PO) Take 15-30 mLs by mouth every 6 (six) hours as needed (for symptoms).   Yes [provider]  brompheniramine-pseudoephedrine-DM 30-2-10 MG/5ML syrup Take 5 mLs by mouth 4 (four) times daily as needed. Patient not taking: Reported on 06/23/2019 06/20/19   Scot Jun, FNP  cyclobenzaprine (FLEXERIL) 10 MG tablet Take 1 tablet (10 mg total) by mouth 3 (three) times daily as needed for muscle spasms. Patient not taking: Reported on 06/23/2019 03/25/19   Landis Martins, DPM  diclofenac (VOLTAREN) 75 MG EC tablet Take 1 tablet (75 mg total) by mouth 2 (two) times daily. Patient not taking: Reported on 06/23/2019 03/25/19   Landis Martins, DPM  trimethoprim-polymyxin b (POLYTRIM) ophthalmic solution Place 2 drops into both eyes in the morning, at noon, and at bedtime. 06/20/19   Scot Jun, FNP  albuterol (VENTOLIN HFA) 108 (90 Base) MCG/ACT inhaler Inhale 1-2 puffs into the lungs every 6 (six) hours as needed for wheezing or shortness of breath. Patient not taking: Reported on 08/24/2018 07/01/18 11/18/18  Wieters, Madelynn Done C, PA-C     Allergies    Codeine  Review of Systems   Review of Systems  All other systems reviewed and are negative.   Physical Exam Updated Vital Signs BP (!) 118/94 (BP Location: Right Arm)   Pulse (!) 120   Temp (!) 102.5 F (39.2 C) (Oral)   SpO2 96%   Physical Exam Vitals and nursing note reviewed.  Constitutional:      General: She is not in acute distress.    Appearance: Normal appearance. She is well-developed. She is not toxic-appearing.  HENT:     Head: Normocephalic and atraumatic.  Eyes:     General: Lids are normal.     Conjunctiva/sclera: Conjunctivae normal.     Pupils: Pupils are equal, round, and reactive to light.  Neck:     Thyroid: No thyroid mass.     Trachea: No tracheal deviation.  Cardiovascular:     Rate and Rhythm: Regular rhythm. Tachycardia present.     Heart sounds: Normal heart sounds. No murmur. No gallop.   Pulmonary:     Effort: Pulmonary effort is normal. No respiratory distress.     Breath sounds: No stridor. Decreased breath sounds present. No wheezing, rhonchi or rales.  Abdominal:     General: Bowel sounds are normal. There is no distension.     Palpations: Abdomen is soft.     Tenderness: There is no abdominal tenderness. There is no rebound.  Musculoskeletal:        General: No tenderness. Normal range of motion.     Cervical back: Normal range of motion and neck supple.  Skin:    General: Skin is warm and dry.     Findings: No abrasion or rash.  Neurological:     Mental Status: She is alert and oriented to person, place, and time.     GCS: GCS eye subscore is 4. GCS verbal subscore is 5. GCS motor subscore is 6.     Cranial Nerves: No cranial nerve deficit.     Sensory: No sensory deficit.  Psychiatric:        Speech: Speech normal.        Behavior: Behavior normal.     ED Results / Procedures / Treatments   Labs (all labs ordered are listed, but only abnormal results are displayed) Labs Reviewed - No data to  display  EKG None  Radiology No results found.  Procedures Procedures (including critical care time)  Medications Ordered in ED Medications  acetaminophen (TYLENOL) tablet 650 mg (has no administration in  time range)  albuterol (VENTOLIN HFA) 108 (90 Base) MCG/ACT inhaler 4 puff (has no administration in time range)  ipratropium (ATROVENT HFA) inhaler 2 puff (has no administration in time range)  predniSONE (DELTASONE) tablet 60 mg (has no administration in time range)  acetaminophen (TYLENOL) tablet 650 mg (650 mg Oral Given 06/23/19 1530)    ED Course  I have reviewed the triage vital signs and the nursing notes.  Pertinent labs & imaging results that were available during my care of the patient were reviewed by me and considered in my medical decision making (see chart for details).    MDM Rules/Calculators/A&P                      Patient given albuterol as well as Atrovent puffs.  Fever treated with Tylenol.  Chest x-ray shows no acute findings.  Patient's pulse oximetry is 99%.  Also treated with prednisone and feels better.  Will discharge home with inhalers and course of prednisone. Final Clinical Impression(s) / ED Diagnoses Final diagnoses:  None    Rx / DC Orders ED Discharge Orders    None       Lacretia Leigh, MD 06/23/19 1919

## 2019-06-23 NOTE — ED Notes (Signed)
Patient verbalizes understanding of discharge instructions. Opportunity for questioning and answers were provided. Armband removed by staff, pt discharged from ED. Pt. ambulatory and discharged home.  

## 2019-06-24 ENCOUNTER — Ambulatory Visit: Payer: BC Managed Care – PPO | Admitting: Sports Medicine

## 2019-06-26 NOTE — Telephone Encounter (Signed)
Patient called to get infusion however last date was 4/20.   Pt does not qualify for infusion therapy as her symptoms first presented > 10 days prior to timing of infusion. Symptoms tier reviewed as well as criteria for ending isolation. Preventative practices reviewed. Patient verbalized understanding  .  Tracy Rosales, [place]

## 2019-06-27 ENCOUNTER — Emergency Department (HOSPITAL_COMMUNITY): Payer: HRSA Program

## 2019-06-27 ENCOUNTER — Inpatient Hospital Stay (HOSPITAL_COMMUNITY)
Admission: EM | Admit: 2019-06-27 | Discharge: 2019-07-02 | DRG: 177 | Disposition: A | Discharge status: Home / Self Care | Payer: HRSA Program | Attending: Internal Medicine | Admitting: Internal Medicine

## 2019-06-27 ENCOUNTER — Encounter (HOSPITAL_COMMUNITY): Payer: Self-pay

## 2019-06-27 DIAGNOSIS — J4541 Moderate persistent asthma with (acute) exacerbation: Secondary | ICD-10-CM | POA: Diagnosis present

## 2019-06-27 DIAGNOSIS — Z833 Family history of diabetes mellitus: Secondary | ICD-10-CM

## 2019-06-27 DIAGNOSIS — U071 COVID-19: Principal | ICD-10-CM | POA: Diagnosis present

## 2019-06-27 DIAGNOSIS — J9621 Acute and chronic respiratory failure with hypoxia: Secondary | ICD-10-CM | POA: Diagnosis present

## 2019-06-27 DIAGNOSIS — J1282 Pneumonia due to coronavirus disease 2019: Secondary | ICD-10-CM | POA: Diagnosis present

## 2019-06-27 DIAGNOSIS — Z6841 Body Mass Index (BMI) 40.0 and over, adult: Secondary | ICD-10-CM

## 2019-06-27 DIAGNOSIS — Z8249 Family history of ischemic heart disease and other diseases of the circulatory system: Secondary | ICD-10-CM

## 2019-06-27 DIAGNOSIS — J4520 Mild intermittent asthma, uncomplicated: Secondary | ICD-10-CM | POA: Diagnosis present

## 2019-06-27 DIAGNOSIS — J9601 Acute respiratory failure with hypoxia: Secondary | ICD-10-CM

## 2019-06-27 LAB — CBC WITH DIFFERENTIAL/PLATELET
Abs Immature Granulocytes: 0.05 10*3/uL (ref 0.00–0.07)
Basophils Absolute: 0 10*3/uL (ref 0.0–0.1)
Basophils Relative: 0 %
Eosinophils Absolute: 0 10*3/uL (ref 0.0–0.5)
Eosinophils Relative: 0 %
HCT: 39.6 % (ref 36.0–46.0)
Hemoglobin: 12.4 g/dL (ref 12.0–15.0)
Immature Granulocytes: 1 %
Lymphocytes Relative: 7 %
Lymphs Abs: 0.7 10*3/uL (ref 0.7–4.0)
MCH: 29 pg (ref 26.0–34.0)
MCHC: 31.3 g/dL (ref 30.0–36.0)
MCV: 92.7 fL (ref 80.0–100.0)
Monocytes Absolute: 0.3 10*3/uL (ref 0.1–1.0)
Monocytes Relative: 3 %
Neutro Abs: 9.5 10*3/uL — ABNORMAL HIGH (ref 1.7–7.7)
Neutrophils Relative %: 89 %
Platelets: 338 10*3/uL (ref 150–400)
RBC: 4.27 MIL/uL (ref 3.87–5.11)
RDW: 14.1 % (ref 11.5–15.5)
WBC: 10.7 10*3/uL — ABNORMAL HIGH (ref 4.0–10.5)
nRBC: 0 % (ref 0.0–0.2)

## 2019-06-27 MED ORDER — IPRATROPIUM BROMIDE HFA 17 MCG/ACT IN AERS
2.0000 | INHALATION_SPRAY | Freq: Once | RESPIRATORY_TRACT | Status: AC
  Administered 2019-06-27: 2 via RESPIRATORY_TRACT
  Filled 2019-06-27: qty 12.9

## 2019-06-27 MED ORDER — HYDROCOD POLST-CPM POLST ER 10-8 MG/5ML PO SUER
5.0000 mL | Freq: Once | ORAL | Status: AC
  Administered 2019-06-27: 5 mL via ORAL
  Filled 2019-06-27: qty 5

## 2019-06-27 MED ORDER — ALBUTEROL SULFATE HFA 108 (90 BASE) MCG/ACT IN AERS
8.0000 | INHALATION_SPRAY | Freq: Once | RESPIRATORY_TRACT | Status: AC
  Administered 2019-06-27: 8 via RESPIRATORY_TRACT
  Filled 2019-06-27: qty 6.7

## 2019-06-27 MED ORDER — ACETAMINOPHEN 500 MG PO TABS
1000.0000 mg | ORAL_TABLET | Freq: Once | ORAL | Status: AC
  Administered 2019-06-27: 1000 mg via ORAL
  Filled 2019-06-27 (×2): qty 2

## 2019-06-27 MED ORDER — METHYLPREDNISOLONE SODIUM SUCC 125 MG IJ SOLR
125.0000 mg | Freq: Once | INTRAMUSCULAR | Status: AC
  Administered 2019-06-27: 125 mg via INTRAVENOUS
  Filled 2019-06-27: qty 2

## 2019-06-27 NOTE — ED Provider Notes (Signed)
[facility] EMERGENCY DEPARTMENT Provider Note   CSN: [id number] Arrival date & time: 06/27/19  2119     History Chief Complaint  Patient presents with  . Shortness of Breath    COVID +    Tracy Rosales is a 44 y.o. female.  44 yo F with a chief complaints of cough fever and shortness of breath.  This is been waxing and waning since she was diagnosed with novel coronavirus about 2 weeks ago.  States that it worsened over the past 48 hours.  Been using her inhaler at home without improvement.  Increased cough and feels that she had increased wheezing.  Currently on prednisone without improvement.  Had some nausea and vomiting at the onset of her illness but none currently.  Denies abdominal pain denies chest pain.  The history is provided by the patient.  Shortness of Breath Severity:  Severe Onset quality:  Gradual Duration:  2 weeks Timing:  Intermittent Progression:  Waxing and waning Chronicity:  New Relieved by:  Nothing Worsened by:  Nothing Ineffective treatments:  Inhaler and position changes Associated symptoms: cough and fever   Associated symptoms: no chest pain, no headaches, no vomiting and no wheezing        Past Medical History:  Diagnosis Date  . Abnormal uterine bleeding   . Asthma   . Bronchitis   . Depression   . Endometriosis   . Headache(784.0)   . Obesity   . Uterine fibroid   . Vaginal Pap smear, abnormal    2017    Patient Active Problem List   Diagnosis Date Noted  . Obesity (BMI 30-39.9) 05/30/2017  . Family history of diabetes mellitus in mother 05/30/2017  . History of PID 05/30/2017  . Endometriosis 05/30/2017  . Fibroids 05/30/2017  . Female dyspareunia 05/30/2017  . Mild intermittent asthma 05/30/2017  . BV (bacterial vaginosis) 05/30/2017  . Pelvic pain in female 10/13/2011    Past Surgical History:  Procedure Laterality Date  . CESAREAN SECTION    . DILATION AND CURETTAGE OF UTERUS     for heavy periods       OB History    Gravida  2   Para  2   Term  2   Preterm      AB      Living  2     SAB      TAB      Ectopic      Multiple      Live Births              Family History  Problem Relation Age of Onset  . Diabetes Mother   . Hypertension Mother   . Thyroid disease Maternal Aunt   . Uterine cancer Cousin   . Migraines Son     Social History   Tobacco Use  . Smoking status: Never Smoker  . Smokeless tobacco: Never Used  Substance Use Topics  . Alcohol use: Yes    Alcohol/week: 2.0 standard drinks    Types: 1 Glasses of wine, 1 Cans of beer per week    Comment: pt reports drinking 3-4 drinks per week  . Drug use: No    Home Medications Prior to Admission medications   Medication Sig Start Date End Date Taking? Authorizing Provider  brompheniramine-pseudoephedrine-DM 30-2-10 MG/5ML syrup Take 5 mLs by mouth 4 (four) times daily as needed. Patient not taking: Reported on 06/23/2019 06/20/19   Scot Jun, FNP  cyclobenzaprine (  FLEXERIL) 10 MG tablet Take 1 tablet (10 mg total) by mouth 3 (three) times daily as needed for muscle spasms. Patient not taking: Reported on 06/23/2019 03/25/19   Landis Martins, DPM  diclofenac (VOLTAREN) 75 MG EC tablet Take 1 tablet (75 mg total) by mouth 2 (two) times daily. Patient not taking: Reported on 06/23/2019 03/25/19   Landis Martins, DPM  ibuprofen (ADVIL) 200 MG tablet Take 200-600 mg by mouth every 6 (six) hours as needed for fever, headache or mild pain.    [provider]  methylPREDNISolone (MEDROL DOSEPAK) 4 MG TBPK tablet Take as directed Patient taking differently: Take 4-24 mg by mouth See admin instructions. Take 24 mg by mouth on day one and decrease by 4 mg daily until finished- starting on 06/21/2019 03/25/19   Landis Martins, DPM  predniSONE (DELTASONE) 50 MG tablet 1 p.o. daily x5 06/23/19   Lacretia Leigh, MD  Pseudoeph-Doxylamine-DM-APAP (NYQUIL MULTI-SYMPTOM PO) Take 15-30 mLs by mouth  every 6 (six) hours as needed (for symptoms).    [provider]  trimethoprim-polymyxin b (POLYTRIM) ophthalmic solution Place 2 drops into both eyes in the morning, at noon, and at bedtime. 06/20/19   Scot Jun, FNP  albuterol (VENTOLIN HFA) 108 (90 Base) MCG/ACT inhaler Inhale 1-2 puffs into the lungs every 6 (six) hours as needed for wheezing or shortness of breath. Patient not taking: Reported on 08/24/2018 07/01/18 11/18/18  Wieters, Madelynn Done C, PA-C    Allergies    Codeine  Review of Systems   Review of Systems  Constitutional: Positive for chills and fever.  HENT: Negative for congestion and rhinorrhea.   Eyes: Negative for redness and visual disturbance.  Respiratory: Positive for cough and shortness of breath. Negative for wheezing.   Cardiovascular: Negative for chest pain and palpitations.  Gastrointestinal: Negative for nausea and vomiting.  Genitourinary: Negative for dysuria and urgency.  Musculoskeletal: Positive for myalgias. Negative for arthralgias.  Skin: Negative for pallor and wound.  Neurological: Negative for dizziness and headaches.    Physical Exam Updated Vital Signs BP 127/88   Pulse 91   Temp 99.6 F (37.6 C) (Oral)   Resp (!) 21   Ht 5\' 4"  (1.626 m)   Wt 122.5 kg   SpO2 95%   BMI 46.35 kg/m   Physical Exam Vitals and nursing note reviewed.  Constitutional:      General: She is not in acute distress.    Appearance: She is well-developed. She is obese. She is not diaphoretic.  HENT:     Head: Normocephalic and atraumatic.  Eyes:     Pupils: Pupils are equal, round, and reactive to light.  Cardiovascular:     Rate and Rhythm: Normal rate and regular rhythm.     Heart sounds: No murmur. No friction rub. No gallop.   Pulmonary:     Effort: Pulmonary effort is normal. Tachypnea present.     Breath sounds: Decreased breath sounds present. No wheezing or rales.     Comments: Diminished breath sounds in all fields.  No obvious  wheezes. Abdominal:     General: There is no distension.     Palpations: Abdomen is soft.     Tenderness: There is no abdominal tenderness.  Musculoskeletal:        General: No tenderness.     Cervical back: Normal range of motion and neck supple.  Skin:    General: Skin is warm and dry.  Neurological:     Mental Status: She is  alert and oriented to person, place, and time.  Psychiatric:        Behavior: Behavior normal.     ED Results / Procedures / Treatments   Labs (all labs ordered are listed, but only abnormal results are displayed) Labs Reviewed  CULTURE, BLOOD (ROUTINE X 2)  CULTURE, BLOOD (ROUTINE X 2)  LACTIC ACID, PLASMA  LACTIC ACID, PLASMA  CBC WITH DIFFERENTIAL/PLATELET  COMPREHENSIVE METABOLIC PANEL  D-DIMER, QUANTITATIVE (NOT AT [facility])  PROCALCITONIN  LACTATE DEHYDROGENASE  FERRITIN  TRIGLYCERIDES  FIBRINOGEN  C-REACTIVE PROTEIN  I-STAT BETA HCG BLOOD, ED ([facility], [facility], [facility] ONLY)    EKG EKG Interpretation  Date/Time:  Friday June 27 2019 21:26:00 EDT Ventricular Rate:  89 PR Interval:    QRS Duration: 87 QT Interval:  351 QTC Calculation: 427 R Axis:   9 Text Interpretation: Sinus rhythm Borderline T abnormalities, diffuse leads No significant change since last tracing Confirmed by Deno Etienne [phone]) on 06/27/2019 10:05:05 PM   Radiology DG Chest Port 1 View  Result Date: 06/27/2019 CLINICAL DATA:  Shortness of breath EXAM: PORTABLE CHEST 1 VIEW COMPARISON:  Radiograph 06/23/2019 FINDINGS: Lung volumes are chronically diminished with nonspecific elevation of the left hemidiaphragm which is similar to comparison studies. However, there is new areas of mixed interstitial and consolidative opacity predominantly within the lung bases and particular within the left lung periphery. No pneumothorax is evident. Obscuration of the left costophrenic sulcus could suggest trace pleural fluid. Prominence of the cardiomediastinal silhouette may be related to the low  volumes and portable technique. No acute osseous or soft tissue abnormality. Telemetry leads overlie the chest. IMPRESSION: Findings are concerning for multifocal pneumonia markedly worsened from comparison exam 06/23/2019. Electronically Signed   By: Lovena Le M.D.   On: 06/27/2019 22:01    Procedures Procedures (including critical care time)  Medications Ordered in ED Medications  albuterol (VENTOLIN HFA) 108 (90 Base) MCG/ACT inhaler 8 puff (has no administration in time range)  ipratropium (ATROVENT HFA) inhaler 2 puff (has no administration in time range)  methylPREDNISolone sodium succinate (SOLU-MEDROL) 125 mg/2 mL injection 125 mg (has no administration in time range)    ED Course  I have reviewed the triage vital signs and the nursing notes.  Pertinent labs & imaging results that were available during my care of the patient were reviewed by me and considered in my medical decision making (see chart for details).    MDM Rules/Calculators/A&P                      44 year old female with a chief complaint of cough shortness of breath and fever.  Patient was diagnosed with novel coronavirus.  Has had symptoms for about 2 weeks now.  Hypoxic on arrival in the ED to the low 80s.  Diminished breath sounds in all fields for me.  History of asthma we will give albuterol and ipratropium.  Give Solu-Medrol.  Will discuss with hospitalist for admission.  CRITICAL CARE Performed by: Cecilio Asper   Total critical care time: 35 minutes  Critical care time was exclusive of separately billable procedures and treating other patients.  Critical care was necessary to treat or prevent imminent or life-threatening deterioration.  Critical care was time spent personally by me on the following activities: development of treatment plan with patient and/or surrogate as well as nursing, discussions with consultants, evaluation of patient's response to treatment, examination of patient,  obtaining history from patient or surrogate, ordering and performing  treatments and interventions, ordering and review of laboratory studies, ordering and review of radiographic studies, pulse oximetry and re-evaluation of patient's condition.  Final Clinical Impression(s) / ED Diagnoses Final diagnoses:  Acute on chronic respiratory failure with hypoxia ([place])  Moderate persistent asthma with exacerbation  Pneumonia due to COVID-19 virus    Rx / DC Orders ED Discharge Orders    None       Deno Etienne, DO 06/27/19 2222

## 2019-06-27 NOTE — ED Triage Notes (Signed)
Pt arrives via [facility] from home.  Pt endorses increase work of breathing with any effort. Constantly coughing with chest.  Pt states she feels like she can't do anything without getting out of breath.  On EMS arrival, RA sp02 80%, placed on 3L.  On arrival, spo2% 95 on 3L.  Hx of asthma.  A&ox4

## 2019-06-28 ENCOUNTER — Encounter (HOSPITAL_COMMUNITY): Payer: Self-pay | Admitting: Internal Medicine

## 2019-06-28 ENCOUNTER — Other Ambulatory Visit: Payer: Self-pay

## 2019-06-28 DIAGNOSIS — U071 COVID-19: Secondary | ICD-10-CM | POA: Diagnosis present

## 2019-06-28 DIAGNOSIS — J9601 Acute respiratory failure with hypoxia: Secondary | ICD-10-CM

## 2019-06-28 DIAGNOSIS — Z8249 Family history of ischemic heart disease and other diseases of the circulatory system: Secondary | ICD-10-CM | POA: Diagnosis not present

## 2019-06-28 DIAGNOSIS — Z833 Family history of diabetes mellitus: Secondary | ICD-10-CM | POA: Diagnosis not present

## 2019-06-28 DIAGNOSIS — J1282 Pneumonia due to coronavirus disease 2019: Secondary | ICD-10-CM | POA: Diagnosis present

## 2019-06-28 DIAGNOSIS — Z6841 Body Mass Index (BMI) 40.0 and over, adult: Secondary | ICD-10-CM | POA: Diagnosis not present

## 2019-06-28 DIAGNOSIS — J4541 Moderate persistent asthma with (acute) exacerbation: Secondary | ICD-10-CM | POA: Diagnosis present

## 2019-06-28 DIAGNOSIS — J9621 Acute and chronic respiratory failure with hypoxia: Secondary | ICD-10-CM | POA: Diagnosis present

## 2019-06-28 LAB — HIV ANTIBODY (ROUTINE TESTING W REFLEX): HIV Screen 4th Generation wRfx: NONREACTIVE

## 2019-06-28 LAB — COMPREHENSIVE METABOLIC PANEL
ALT: 32 U/L (ref 0–44)
AST: 25 U/L (ref 15–41)
Albumin: 3.2 g/dL — ABNORMAL LOW (ref 3.5–5.0)
Alkaline Phosphatase: 40 U/L (ref 38–126)
Anion gap: 10 (ref 5–15)
BUN: 10 mg/dL (ref 6–20)
CO2: 26 mmol/L (ref 22–32)
Calcium: 8.7 mg/dL — ABNORMAL LOW (ref 8.9–10.3)
Chloride: 106 mmol/L (ref 98–111)
Creatinine, Ser: 0.55 mg/dL (ref 0.44–1.00)
GFR calc Af Amer: >60 mL/min (ref >60)
GFR calc non Af Amer: >60 mL/min (ref >60)
Glucose, Bld: 124 mg/dL — ABNORMAL HIGH (ref 70–99)
Potassium: 4.1 mmol/L (ref 3.5–5.1)
Sodium: 142 mmol/L (ref 135–145)
Total Bilirubin: 0.7 mg/dL (ref 0.3–1.2)
Total Protein: 6.9 g/dL (ref 6.5–8.1)

## 2019-06-28 LAB — LACTATE DEHYDROGENASE
LDH: 294 U/L — ABNORMAL HIGH (ref 98–192)
LDH: 255 U/L — ABNORMAL HIGH (ref 98–192)

## 2019-06-28 LAB — I-STAT BETA HCG BLOOD, ED (MC, WL, AP ONLY): I-stat hCG, quantitative: <5 m[IU]/mL (ref <5)

## 2019-06-28 LAB — BRAIN NATRIURETIC PEPTIDE: B Natriuretic Peptide: 22.4 pg/mL (ref 0.0–100.0)

## 2019-06-28 LAB — C-REACTIVE PROTEIN: CRP: 11.5 mg/dL — ABNORMAL HIGH (ref <1.0)

## 2019-06-28 LAB — PROCALCITONIN: Procalcitonin: <0.1 ng/mL

## 2019-06-28 LAB — LACTIC ACID, PLASMA
Lactic Acid, Venous: 1.1 mmol/L (ref 0.5–1.9)
Lactic Acid, Venous: 3.1 mmol/L (ref 0.5–1.9)

## 2019-06-28 LAB — FERRITIN: Ferritin: 103 ng/mL (ref 11–307)

## 2019-06-28 LAB — FIBRINOGEN: Fibrinogen: 722 mg/dL — ABNORMAL HIGH (ref 210–475)

## 2019-06-28 LAB — TRIGLYCERIDES: Triglycerides: 80 mg/dL (ref <150)

## 2019-06-28 LAB — ABO/RH: ABO/RH(D): B POS

## 2019-06-28 LAB — D-DIMER, QUANTITATIVE: D-Dimer, Quant: 0.59 ug/mL-FEU — ABNORMAL HIGH (ref 0.00–0.50)

## 2019-06-28 LAB — MAGNESIUM: Magnesium: 2.4 mg/dL (ref 1.7–2.4)

## 2019-06-28 MED ORDER — SODIUM CHLORIDE 0.9 % IV SOLN
100.0000 mg | Freq: Every day | INTRAVENOUS | Status: AC
  Administered 2019-06-29 – 2019-07-02 (×4): 100 mg via INTRAVENOUS
  Filled 2019-06-28 (×4): qty 20

## 2019-06-28 MED ORDER — ACETAMINOPHEN 325 MG PO TABS
650.0000 mg | ORAL_TABLET | Freq: Four times a day (QID) | ORAL | Status: DC | PRN
  Administered 2019-06-29: 650 mg via ORAL
  Filled 2019-06-28: qty 2

## 2019-06-28 MED ORDER — SODIUM CHLORIDE 0.9 % IV SOLN
100.0000 mg | INTRAVENOUS | Status: AC
  Administered 2019-06-28: 100 mg via INTRAVENOUS
  Filled 2019-06-28 (×5): qty 20

## 2019-06-28 MED ORDER — MOMETASONE FURO-FORMOTEROL FUM 200-5 MCG/ACT IN AERO
2.0000 | INHALATION_SPRAY | Freq: Two times a day (BID) | RESPIRATORY_TRACT | Status: DC
  Administered 2019-06-28 – 2019-07-02 (×8): 2 via RESPIRATORY_TRACT
  Filled 2019-06-28: qty 8.8

## 2019-06-28 MED ORDER — ZINC SULFATE 220 (50 ZN) MG PO CAPS
220.0000 mg | ORAL_CAPSULE | Freq: Every day | ORAL | Status: DC
  Administered 2019-06-28 – 2019-07-02 (×5): 220 mg via ORAL
  Filled 2019-06-28 (×5): qty 1

## 2019-06-28 MED ORDER — ALBUTEROL SULFATE HFA 108 (90 BASE) MCG/ACT IN AERS
2.0000 | INHALATION_SPRAY | Freq: Four times a day (QID) | RESPIRATORY_TRACT | Status: DC
  Administered 2019-06-28 – 2019-07-02 (×16): 2 via RESPIRATORY_TRACT
  Filled 2019-06-28: qty 6.7

## 2019-06-28 MED ORDER — DEXAMETHASONE SODIUM PHOSPHATE 10 MG/ML IJ SOLN: 6.0000 mg | Freq: Every day | INTRAMUSCULAR | Status: DC

## 2019-06-28 MED ORDER — GUAIFENESIN-DM 100-10 MG/5ML PO SYRP
10.0000 mL | ORAL_SOLUTION | ORAL | Status: DC | PRN
  Administered 2019-06-28: 10 mL via ORAL
  Filled 2019-06-28: qty 10

## 2019-06-28 MED ORDER — FUROSEMIDE 10 MG/ML IJ SOLN
40.0000 mg | Freq: Once | INTRAMUSCULAR | Status: AC
  Administered 2019-06-28: 40 mg via INTRAVENOUS
  Filled 2019-06-28: qty 4

## 2019-06-28 MED ORDER — ENOXAPARIN SODIUM 60 MG/0.6ML ~~LOC~~ SOLN
50.0000 mg | Freq: Every day | SUBCUTANEOUS | Status: DC
  Administered 2019-06-29 – 2019-06-30 (×2): 50 mg via SUBCUTANEOUS
  Filled 2019-06-28 (×4): qty 0.6

## 2019-06-28 MED ORDER — METHYLPREDNISOLONE SODIUM SUCC 125 MG IJ SOLR
60.0000 mg | Freq: Two times a day (BID) | INTRAMUSCULAR | Status: DC
  Administered 2019-06-28 – 2019-06-30 (×6): 60 mg via INTRAVENOUS
  Filled 2019-06-28 (×6): qty 2

## 2019-06-28 MED ORDER — ENOXAPARIN SODIUM 40 MG/0.4ML ~~LOC~~ SOLN
40.0000 mg | Freq: Every day | SUBCUTANEOUS | Status: DC
  Administered 2019-06-28: 40 mg via SUBCUTANEOUS
  Filled 2019-06-28: qty 0.4

## 2019-06-28 MED ORDER — ORAL CARE MOUTH RINSE
15.0000 mL | Freq: Two times a day (BID) | OROMUCOSAL | Status: DC
  Administered 2019-06-28 – 2019-07-02 (×7): 15 mL via OROMUCOSAL

## 2019-06-28 MED ORDER — ASCORBIC ACID 500 MG PO TABS
500.0000 mg | ORAL_TABLET | Freq: Every day | ORAL | Status: DC
  Administered 2019-06-28 – 2019-07-02 (×5): 500 mg via ORAL
  Filled 2019-06-28 (×5): qty 1

## 2019-06-28 MED ORDER — SODIUM CHLORIDE 0.9 % IV SOLN
100.0000 mg | INTRAVENOUS | Status: AC
  Administered 2019-06-28: 100 mg via INTRAVENOUS
  Filled 2019-06-28: qty 20

## 2019-06-28 MED ORDER — TOCILIZUMAB 400 MG/20ML IV SOLN
800.0000 mg | Freq: Once | INTRAVENOUS | Status: AC
  Administered 2019-06-28: 800 mg via INTRAVENOUS
  Filled 2019-06-28: qty 40

## 2019-06-28 NOTE — Plan of Care (Signed)
Patient oxygen sats remaining stable on 6LNC, decrease in resp drive and effort.  Will continue to monitor closely

## 2019-06-28 NOTE — Progress Notes (Signed)
Pt scores yellow on MEWS due to resppiratory rate. Rate is consistent with her Covid 19 diagnosis and chronic respiratory history.

## 2019-06-28 NOTE — Progress Notes (Signed)
PROGRESS NOTE                                                                                                                                                                                                             Patient Demographics:    Tracy Rosales, is a 44 y.o. female, DOB - Nov 23, 1975, ANV:[id number]  Outpatient Primary MD for the patient is Patient, No Pcp Per    LOS - 0  Admit date - 06/27/2019    Chief Complaint  Patient presents with  . Shortness of Breath    COVID +       Brief Narrative - Tracy Rosales is a 44 y.o. female with medical history significant of asthma, bronchitis, depression, morbid obesity (BMI 46.35) presenting with complaints of shortness of breath.  She recently tested positive for COVID-19 on 4/16 during an urgent care visit, in the ER this admission she was found to have acute hypoxic respiratory failure due to COVID-19 pneumonia and admitted to the hospital.   Subjective:    Tracy Rosales today has, No headache, No chest pain, No abdominal pain - No Nausea, No new weakness tingling or numbness, +ve SOB.   Assessment  & Plan :     1. Acute Hypoxic Resp. Failure due to Acute Covid 19 Viral Pneumonitis during the ongoing 2020 Covid 19 Pandemic - she has severe disease intense hypoxia and increased work of breathing, has been treated with IV steroids, remdesivir and Actemra after consent on 06/28/2019.  We will continue to monitor closely along with inflammatory markers.  Due to hypoxia and increased work of breathing mildly elevated lactic acid however does not appear toxic or septic.  Encouraged the patient to sit up in chair in the daytime use I-S and flutter valve for pulmonary toiletry and then prone in bed when at night.  Will advance activity and titrate down oxygen as possible.  Actemra off label use - patient was told that if COVID-19 pneumonitis gets worse we might potentially use Actemra  off label, patient denies any known history of active diverticulitis, tuberculosis or hepatitis, understands the risks and benefits and wants to proceed with Actemra treatment if required.  SpO2: 93 % O2 Flow Rate (L/min): 6 L/min  Recent Labs  Lab 06/27/19 2321 06/28/19 0339  CRP 11.5*  --   DDIMER 0.59*  --  BNP  --  22.4  PROCALCITON <0.10  --     Hepatic Function Latest Ref Rng & Units 06/27/2019 08/24/2018 05/30/2017  Total Protein 6.5 - 8.1 g/dL 6.9 6.8 6.8  Albumin 3.5 - 5.0 g/dL 3.2(L) 3.7 4.4  AST 15 - 41 U/L '25 26 23  ' ALT 0 - 44 U/L 32 32 21  Alk Phosphatase 38 - 126 U/L 40 46 52  Total Bilirubin 0.3 - 1.2 mg/dL 0.7 0.4 0.3    2.  Obesity with BMI of 40.  Follow with PCP.  3.  Mild intermittent asthma.  Currently stable.  On scheduled and as needed albuterol inhalations.    Condition - Extremely Guarded  Family Communication  : Mother Velva Harman [phone] on 06/28/19 - 2.45pm   Code Status : Full  Diet :   Diet Order            Diet Heart Room service appropriate? Yes; Fluid consistency: Thin  Diet effective now               Disposition Plan  : Stay inpatient in the hospital for treatment of acute hypoxic respiratory failure caused by COVID-19 pneumonia.  Consults  :  None  Procedures  :  None  PUD Prophylaxis :   DVT Prophylaxis  :  Lovenox   Lab Results  Component Value Date   PLT 338 06/27/2019    Inpatient Medications  Scheduled Meds: . albuterol  2 puff Inhalation Q6H  . vitamin C  500 mg Oral Daily  . [START ON 06/29/2019] enoxaparin (LOVENOX) injection  50 mg Subcutaneous Daily  . furosemide  40 mg Intravenous Once  . mouth rinse  15 mL Mouth Rinse BID  . methylPREDNISolone (SOLU-MEDROL) injection  60 mg Intravenous BID  . zinc sulfate  220 mg Oral Daily   Continuous Infusions: . [START ON 06/29/2019] remdesivir 100 mg in NS 100 mL     PRN Meds:.acetaminophen, guaiFENesin-dextromethorphan  Antibiotics  :    Anti-infectives (From  admission, onward)   Start     Dose/Rate Route Frequency Ordered Stop   06/29/19 1000  remdesivir 100 mg in sodium chloride 0.9 % 100 mL IVPB     100 mg 200 mL/hr over 30 Minutes Intravenous Daily 06/28/19 0128 07/03/19 0959   06/28/19 0330  remdesivir 100 mg in sodium chloride 0.9 % 100 mL IVPB     100 mg 200 mL/hr over 30 Minutes Intravenous Every 30 min 06/28/19 0321 06/28/19 0525   06/28/19 0130  remdesivir 100 mg in sodium chloride 0.9 % 100 mL IVPB     100 mg 200 mL/hr over 30 Minutes Intravenous Every 30 min 06/28/19 0128 06/28/19 0229       Time Spent in minutes  30   Lala Lund M.D on 06/28/2019 at 2:39 PM  To page go to [email or web address] - password [facility]  [facility] -  Office  [phone]  See all Orders from today for further details    Objective:   Vitals:   06/28/19 0828 06/28/19 1031 06/28/19 1139 06/28/19 1200  BP: 118/78 125/77  124/81  Pulse: 80 76 78 78  Resp: (!) 29 (!) 29 (!) 33 (!) 30  Temp: 98.4 F (36.9 C) 98.6 F (37 C)    TempSrc:  Oral    SpO2: 95% 98% 94% 93%  Weight:      Height:        Wt Readings from Last 3 Encounters:  06/28/19 106.6 kg  06/20/19  122.5 kg  08/24/18 99.8 kg     Intake/Output Summary (Last 24 hours) at 06/28/2019 1439 Last data filed at 06/28/2019 0900 Gross per 24 hour  Intake 410 ml  Output --  Net 410 ml     Physical Exam  Awake Alert, No new F.N deficits, Normal affect [place].AT,PERRAL Supple Neck,No JVD, No cervical lymphadenopathy appriciated.  Symmetrical Chest wall movement, Good air movement bilaterally, CTAB RRR,No Gallops,Rubs or new Murmurs, No Parasternal Heave +ve B.Sounds, Abd Soft, No tenderness, No organomegaly appriciated, No rebound - guarding or rigidity. No Cyanosis, Clubbing or edema, No new Rash or bruise      Data Review:    CBC Recent Labs  Lab 06/27/19 2321  WBC 10.7*  HGB 12.4  HCT 39.6  PLT 338  MCV 92.7  MCH 29.0  MCHC 31.3  RDW 14.1  LYMPHSABS 0.7  MONOABS  0.3  EOSABS 0.0  BASOSABS 0.0    Chemistries  Recent Labs  Lab 06/27/19 2321 06/28/19 0339  NA 142  --   K 4.1  --   CL 106  --   CO2 26  --   GLUCOSE 124*  --   BUN 10  --   CREATININE 0.55  --   CALCIUM 8.7*  --   AST 25  --   ALT 32  --   ALKPHOS 40  --   BILITOT 0.7  --   MG  --  2.4     ------------------------------------------------------------------------------------------------------------------ Recent Labs    06/27/19 2321  TRIG 80    Lab Results  Component Value Date   HGBA1C 5.6% 05/30/2017   ------------------------------------------------------------------------------------------------------------------ No results for input(s): TSH, T4TOTAL, T3FREE, THYROIDAB in the last 72 hours.  Invalid input(s): FREET3  Cardiac Enzymes No results for input(s): CKMB, TROPONINI, MYOGLOBIN in the last 168 hours.  Invalid input(s): CK ------------------------------------------------------------------------------------------------------------------    Component Value Date/Time   BNP 22.4 06/28/2019 9373    Micro Results Recent Results (from the past 240 hour(s))  SARS CORONAVIRUS 2 (TAT 6-24 HRS) Nasopharyngeal Nasopharyngeal Swab     Status: Abnormal   Collection Time: 06/20/19  2:48 PM   Specimen: Nasopharyngeal Swab  Result Value Ref Range Status   SARS Coronavirus 2 POSITIVE (A) NEGATIVE Final    Comment: EMAILED [place] ON 06/21/2019 (NOTE) SARS-CoV-2 target nucleic acids are DETECTED. The SARS-CoV-2 RNA is generally detectable in upper and lower respiratory specimens during the acute phase of infection. Positive results are indicative of the presence of SARS-CoV-2 RNA. Clinical correlation with patient history and other diagnostic information is  necessary to determine patient infection status. Positive results do not rule out bacterial infection or co-infection with other viruses.  The expected result is Negative. Fact Sheet  for Patients: SugarRoll.be Fact Sheet for Healthcare Providers: https://www.woods-mathews.com/ This test is not yet approved or cleared by the [place] FDA and  has been authorized for detection and/or diagnosis of SARS-CoV-2 by FDA under an Emergency Use Authorization (EUA). This EUA will remain  in effect (meaning this test can be used) for the duration of the COVID-19 declaratio n under Section 564(b)(1) of the Act, 21 U.S.C. section 360bbb-3(b)(1), unless the authorization is terminated or revoked sooner. Performed at [facility], [place] [address] [place], [place] [address]     Radiology Reports DG Chest Cricket 1 View  Result Date: 06/27/2019 CLINICAL DATA:  Shortness of breath EXAM: PORTABLE CHEST 1 VIEW COMPARISON:  Radiograph 06/23/2019 FINDINGS: Lung volumes  are chronically diminished with nonspecific elevation of the left hemidiaphragm which is similar to comparison studies. However, there is new areas of mixed interstitial and consolidative opacity predominantly within the lung bases and particular within the left lung periphery. No pneumothorax is evident. Obscuration of the left costophrenic sulcus could suggest trace pleural fluid. Prominence of the cardiomediastinal silhouette may be related to the low volumes and portable technique. No acute osseous or soft tissue abnormality. Telemetry leads overlie the chest. IMPRESSION: Findings are concerning for multifocal pneumonia markedly worsened from comparison exam 06/23/2019. Electronically Signed   By: Lovena Le M.D.   On: 06/27/2019 22:01   DG Chest Port 1 View  Result Date: 06/23/2019 CLINICAL DATA:  44 year old female with fever. EXAM: PORTABLE CHEST 1 VIEW COMPARISON:  Chest radiograph dated 08/24/2018. FINDINGS: There is mild eventration of the left hemidiaphragm with minimal left lung base atelectasis. No focal consolidation, pleural effusion, pneumothorax. The cardiac  silhouette patch that stable cardiac silhouette. No acute osseous pathology. IMPRESSION: No active disease. Electronically Signed   By: Anner Crete M.D.   On: 06/23/2019 18:55

## 2019-06-28 NOTE — Progress Notes (Signed)
ABG attempted with no success PT complained pain was to bad and asked to retract needle before I could obtain the sample.

## 2019-06-28 NOTE — H&P (Signed)
History and Physical    Tracy Rosales [id number] DOB: 03/19/1975 DOA: 06/27/2019  PCP: Patient, No Pcp Per Patient coming from: Home  Chief Complaint: Shortness of breath  HPI: Tracy Rosales is a 44 y.o. female with medical history significant of asthma, bronchitis, depression, morbid obesity (BMI 46.35) presenting with complaints of shortness of breath.  She recently tested positive for COVID-19 on 4/16 during an urgent care visit.  Subsequently seen in the ED on 4/19 and chest x-ray was clear at that time.  She was discharged with inhalers and a course of prednisone.  Patient states her breathing has continued to worsen.  She is coughing a lot and it is making her chest and abdomen sore.  She has had a few episodes of loose stools.  Denies nausea, vomiting, or abdominal pain.  ED Course: Afebrile.  Tachypneic and hypoxic.  Oxygen saturation 89% on 3 L, improved with 6 L supplemental oxygen.  No significant leukocytosis on labs.  Lactic acid normal.  Blood culture x2 pending.  Procalcitonin <0.10. D-dimer 0.59, LDH 294, fibrinogen 722, and CRP 11.5.  Chest x-ray with findings concerning for multifocal pneumonia.  Patient received albuterol, Tussionex, ipratropium, and Solu-Medrol 125 mg.  Review of Systems:  All systems reviewed and apart from history of presenting illness, are negative.  Past Medical History:  Diagnosis Date  . Abnormal uterine bleeding   . Asthma   . Bronchitis   . Depression   . Endometriosis   . Headache(784.0)   . Obesity   . Uterine fibroid   . Vaginal Pap smear, abnormal    2017    Past Surgical History:  Procedure Laterality Date  . CESAREAN SECTION    . DILATION AND CURETTAGE OF UTERUS     for heavy periods      reports that she has never smoked. She has never used smokeless tobacco. She reports current alcohol use of about 2.0 standard drinks of alcohol per week. She reports that she does not use drugs.  Allergies  Allergen Reactions  . Codeine  Itching    Family History  Problem Relation Age of Onset  . Diabetes Mother   . Hypertension Mother   . Thyroid disease Maternal Aunt   . Uterine cancer Cousin   . Migraines Son     Prior to Admission medications   Medication Sig Start Date End Date Taking? Authorizing Provider  ibuprofen (ADVIL) 200 MG tablet Take 600 mg by mouth every 6 (six) hours as needed for fever, headache or mild pain.    Yes [provider]  trimethoprim-polymyxin b (POLYTRIM) ophthalmic solution Place 2 drops into both eyes in the morning, at noon, and at bedtime. 06/20/19  Yes Scot Jun, FNP  brompheniramine-pseudoephedrine-DM 30-2-10 MG/5ML syrup Take 5 mLs by mouth 4 (four) times daily as needed. Patient not taking: Reported on 06/23/2019 06/20/19   Scot Jun, FNP  cyclobenzaprine (FLEXERIL) 10 MG tablet Take 1 tablet (10 mg total) by mouth 3 (three) times daily as needed for muscle spasms. Patient not taking: Reported on 06/23/2019 03/25/19   Landis Martins, DPM  diclofenac (VOLTAREN) 75 MG EC tablet Take 1 tablet (75 mg total) by mouth 2 (two) times daily. Patient not taking: Reported on 06/23/2019 03/25/19   Landis Martins, DPM  methylPREDNISolone (MEDROL DOSEPAK) 4 MG TBPK tablet Take as directed Patient not taking: Reported on 06/28/2019 03/25/19   Landis Martins, DPM  predniSONE (DELTASONE) 50 MG tablet 1 p.o. daily x5 Patient not taking: Reported  on 06/28/2019 06/23/19   Lacretia Leigh, MD  albuterol (VENTOLIN HFA) 108 (90 Base) MCG/ACT inhaler Inhale 1-2 puffs into the lungs every 6 (six) hours as needed for wheezing or shortness of breath. Patient not taking: Reported on 08/24/2018 07/01/18 11/18/18  Janith Lima, PA-C    Physical Exam: Vitals:   06/28/19 0030 06/28/19 0045 06/28/19 0100 06/28/19 0115  BP: 135/85 136/88 109/86 113/76  Pulse: 84 91 88 83  Resp: (!) 44 (!) 26 (!) 43 (!) 29  Temp:      TempSrc:      SpO2: (!) 85% 94% 93% 93%  Weight:      Height:         Physical Exam  Constitutional: She is oriented to person, place, and time. She appears well-developed and well-nourished. No distress.  HENT:  Head: Normocephalic.  Eyes: Right eye exhibits no discharge. Left eye exhibits no discharge.  Cardiovascular: Normal rate, regular rhythm and intact distal pulses.  Pulmonary/Chest: She has no wheezes.  Tachypneic with respiratory rate in the 20s Equal air entry bilaterally Satting well on 6 L supplemental oxygen  Abdominal: Soft. Bowel sounds are normal. She exhibits no distension. There is no abdominal tenderness. There is no guarding.  Musculoskeletal:        General: No edema.     Cervical back: Neck supple.  Neurological: She is alert and oriented to person, place, and time.  Skin: Skin is warm and dry. She is not diaphoretic.     Labs on Admission: I have personally reviewed following labs and imaging studies  CBC: Recent Labs  Lab 06/27/19 2321  WBC 10.7*  NEUTROABS 9.5*  HGB 12.4  HCT 39.6  MCV 92.7  PLT [id number]   Basic Metabolic Panel: Recent Labs  Lab 06/27/19 2321  NA 142  K 4.1  CL 106  CO2 26  GLUCOSE 124*  BUN 10  CREATININE 0.55  CALCIUM 8.7*   GFR: Estimated Creatinine Clearance: 115.9 mL/min (by C-G formula based on SCr of 0.55 mg/dL). Liver Function Tests: Recent Labs  Lab 06/27/19 2321  AST 25  ALT 32  ALKPHOS 40  BILITOT 0.7  PROT 6.9  ALBUMIN 3.2*   No results for input(s): LIPASE, AMYLASE in the last 168 hours. No results for input(s): AMMONIA in the last 168 hours. Coagulation Profile: No results for input(s): INR, PROTIME in the last 168 hours. Cardiac Enzymes: No results for input(s): CKTOTAL, CKMB, CKMBINDEX, TROPONINI in the last 168 hours. BNP (last 3 results) No results for input(s): PROBNP in the last 8760 hours. HbA1C: No results for input(s): HGBA1C in the last 72 hours. CBG: No results for input(s): GLUCAP in the last 168 hours. Lipid Profile: Recent Labs     06/27/19 2321  TRIG 80   Thyroid Function Tests: No results for input(s): TSH, T4TOTAL, FREET4, T3FREE, THYROIDAB in the last 72 hours. Anemia Panel: Recent Labs    06/27/19 2321  FERRITIN 103   Urine analysis:    Component Value Date/Time   COLORURINE YELLOW 09/21/2016 2010   APPEARANCEUR CLEAR 09/21/2016 2010   LABSPEC 1.030 05/30/2017 1327   PHURINE 5.0 09/21/2016 2010   GLUCOSEU NEGATIVE 09/21/2016 2010   HGBUR MODERATE (A) 09/21/2016 2010   BILIRUBINUR negative 05/30/2017 1327   KETONESUR negative 05/30/2017 [place] 09/21/2016 2010   PROTEINUR negative 05/30/2017 1327   PROTEINUR NEGATIVE 09/21/2016 2010   UROBILINOGEN 0.2 03/23/2012 1215   NITRITE Negative 05/30/2017 1327   NITRITE NEGATIVE  09/21/2016 2010   LEUKOCYTESUR Negative 05/30/2017 1327    Radiological Exams on Admission: DG Chest Port 1 View  Result Date: 06/27/2019 CLINICAL DATA:  Shortness of breath EXAM: PORTABLE CHEST 1 VIEW COMPARISON:  Radiograph 06/23/2019 FINDINGS: Lung volumes are chronically diminished with nonspecific elevation of the left hemidiaphragm which is similar to comparison studies. However, there is new areas of mixed interstitial and consolidative opacity predominantly within the lung bases and particular within the left lung periphery. No pneumothorax is evident. Obscuration of the left costophrenic sulcus could suggest trace pleural fluid. Prominence of the cardiomediastinal silhouette may be related to the low volumes and portable technique. No acute osseous or soft tissue abnormality. Telemetry leads overlie the chest. IMPRESSION: Findings are concerning for multifocal pneumonia markedly worsened from comparison exam 06/23/2019. Electronically Signed   By: Lovena Le M.D.   On: 06/27/2019 22:01    EKG: Independently reviewed.  Sinus rhythm, borderline T wave abnormalities in inferior and lateral leads.  No significant change since prior  tracing.  Assessment/Plan Principal Problem:   Pneumonia due to COVID-19 virus Active Problems:   Mild intermittent asthma   Acute respiratory failure with hypoxia (HCC)   Morbid obesity (HCC)   Acute hypoxic respiratory failure secondary to COVID-19 pneumonia: Tested positive for COVID-19 on 4/16.  Chest x-ray showing multifocal pneumonia.  Slightly tachypneic and requiring 6 L supplemental oxygen.  Bacterial pneumonia less likely given no significant leukocytosis and procalcitonin <0.10. Inflammatory markers elevated-CRP 11.5.  No significant elevation of D-dimer. -Remdesivir dosing per pharmacy -IV Decadron 6 mg daily -Vitamin C, zinc -Antitussive as needed -Tylenol as needed -Scheduled bronchodilator -Daily CBC with differential, CMP, CRP, D-dimer, LDH -Airborne and contact precautions -Prone positioning -Continuous pulse ox -Supplemental oxygen as needed to keep oxygen saturation above 90% -Blood culture x2 pending -Stat ABG -Consider discussing off label use of Tocilizumab if no clinical improvement with therapies mentioned above  Mild intermittent asthma: Stable.  No bronchospasm.  Order scheduled bronchodilator.  Morbid obesity: BMI 46.35.  Continue to counsel on healthy diet and exercise.  HIV screening: The patient falls between the ages of 13-64 and should be screened for HIV, therefore HIV testing ordered.  DVT prophylaxis: Lovenox Code Status: Full code Family Communication: No family available at this time. Disposition Plan: Anticipate discharge after clinical improvement. Consults called: None Admission status: It is my clinical opinion that admission to INPATIENT is reasonable and necessary because of the expectation that this patient will require hospital care that crosses at least 2 midnights to treat this condition based on the medical complexity of the problems presented.  Given the aforementioned information, the predictability of an adverse outcome is felt  to be significant.  The medical decision making on this patient was of high complexity and the patient is at high risk for clinical deterioration, therefore this is a level 3 visit.  Shela Leff MD [facility]  If 7PM-7AM, please contact night-coverage [email or web address]  06/28/2019, 2:03 AM

## 2019-06-28 NOTE — ED Notes (Signed)
Pt's oxygen sats dropped down to 89% on 3L, with respiratory rate increased to 48. Oxygen increased to 6L [address], sats at 96%

## 2019-06-29 LAB — CBC WITH DIFFERENTIAL/PLATELET
Abs Immature Granulocytes: 0.09 10*3/uL — ABNORMAL HIGH (ref 0.00–0.07)
Basophils Absolute: 0 10*3/uL (ref 0.0–0.1)
Basophils Relative: 0 %
Eosinophils Absolute: 0 10*3/uL (ref 0.0–0.5)
Eosinophils Relative: 0 %
HCT: 37.6 % (ref 36.0–46.0)
Hemoglobin: 11.6 g/dL — ABNORMAL LOW (ref 12.0–15.0)
Immature Granulocytes: 1 %
Lymphocytes Relative: 14 %
Lymphs Abs: 1.5 10*3/uL (ref 0.7–4.0)
MCH: 28.4 pg (ref 26.0–34.0)
MCHC: 30.9 g/dL (ref 30.0–36.0)
MCV: 92.2 fL (ref 80.0–100.0)
Monocytes Absolute: 0.8 10*3/uL (ref 0.1–1.0)
Monocytes Relative: 8 %
Neutro Abs: 8.2 10*3/uL — ABNORMAL HIGH (ref 1.7–7.7)
Neutrophils Relative %: 77 %
Platelets: 479 10*3/uL — ABNORMAL HIGH (ref 150–400)
RBC: 4.08 MIL/uL (ref 3.87–5.11)
RDW: 13.7 % (ref 11.5–15.5)
WBC: 10.7 10*3/uL — ABNORMAL HIGH (ref 4.0–10.5)
nRBC: 0 % (ref 0.0–0.2)

## 2019-06-29 LAB — MRSA PCR SCREENING: MRSA by PCR: NEGATIVE

## 2019-06-29 LAB — C-REACTIVE PROTEIN: CRP: 2.8 mg/dL — ABNORMAL HIGH (ref <1.0)

## 2019-06-29 LAB — COMPREHENSIVE METABOLIC PANEL
ALT: 45 U/L — ABNORMAL HIGH (ref 0–44)
AST: 28 U/L (ref 15–41)
Albumin: 2.8 g/dL — ABNORMAL LOW (ref 3.5–5.0)
Alkaline Phosphatase: 43 U/L (ref 38–126)
Anion gap: 10 (ref 5–15)
BUN: 18 mg/dL (ref 6–20)
CO2: 28 mmol/L (ref 22–32)
Calcium: 8.7 mg/dL — ABNORMAL LOW (ref 8.9–10.3)
Chloride: 104 mmol/L (ref 98–111)
Creatinine, Ser: 0.66 mg/dL (ref 0.44–1.00)
GFR calc Af Amer: >60 mL/min (ref >60)
GFR calc non Af Amer: >60 mL/min (ref >60)
Glucose, Bld: 138 mg/dL — ABNORMAL HIGH (ref 70–99)
Potassium: 4.3 mmol/L (ref 3.5–5.1)
Sodium: 142 mmol/L (ref 135–145)
Total Bilirubin: 0.4 mg/dL (ref 0.3–1.2)
Total Protein: 6.4 g/dL — ABNORMAL LOW (ref 6.5–8.1)

## 2019-06-29 LAB — PROCALCITONIN: Procalcitonin: <0.1 ng/mL

## 2019-06-29 LAB — MAGNESIUM: Magnesium: 2.3 mg/dL (ref 1.7–2.4)

## 2019-06-29 LAB — BRAIN NATRIURETIC PEPTIDE: B Natriuretic Peptide: 56.8 pg/mL (ref 0.0–100.0)

## 2019-06-29 LAB — D-DIMER, QUANTITATIVE: D-Dimer, Quant: 0.39 ug/mL-FEU (ref 0.00–0.50)

## 2019-06-29 MED ORDER — SODIUM CHLORIDE 0.9 % IV SOLN: INTRAVENOUS | Status: DC | PRN

## 2019-06-29 MED ORDER — SODIUM CHLORIDE 0.9 % IV SOLN
12.5000 mg | Freq: Four times a day (QID) | INTRAVENOUS | Status: DC | PRN
  Filled 2019-06-29: qty 0.25

## 2019-06-29 MED ORDER — GUAIFENESIN-CODEINE 100-10 MG/5ML PO SOLN: 10.0000 mL | Freq: Four times a day (QID) | ORAL | Status: DC | PRN

## 2019-06-29 MED ORDER — FUROSEMIDE 10 MG/ML IJ SOLN
60.0000 mg | Freq: Once | INTRAMUSCULAR | Status: AC
  Administered 2019-06-29: 60 mg via INTRAVENOUS
  Filled 2019-06-29: qty 6

## 2019-06-29 MED ORDER — POLYVINYL ALCOHOL 1.4 % OP SOLN: 1.0000 [drp] | OPHTHALMIC | Status: DC | PRN

## 2019-06-29 MED ORDER — HYDROCOD POLST-CPM POLST ER 10-8 MG/5ML PO SUER
5.0000 mL | Freq: Two times a day (BID) | ORAL | Status: DC
  Administered 2019-06-29 – 2019-07-02 (×7): 5 mL via ORAL
  Filled 2019-06-29 (×7): qty 5

## 2019-06-29 MED ORDER — DIPHENHYDRAMINE HCL 50 MG/ML IJ SOLN
12.5000 mg | Freq: Four times a day (QID) | INTRAMUSCULAR | Status: DC | PRN
  Administered 2019-06-29: 12.5 mg via INTRAVENOUS
  Filled 2019-06-29: qty 1

## 2019-06-29 NOTE — Progress Notes (Signed)
PROGRESS NOTE                                                                                                                                                                                                             Patient Demographics:    Tracy Rosales, is a 44 y.o. female, DOB - 1975-10-11, ZOX:[id number]  Outpatient Primary MD for the patient is Patient, No Pcp Per    LOS - 1  Admit date - 06/27/2019    Chief Complaint  Patient presents with  . Shortness of Breath    COVID +       Brief Narrative - Tracy Rosales is a 44 y.o. female with medical history significant of asthma, bronchitis, depression, morbid obesity (BMI 46.35) presenting with complaints of shortness of breath.  She recently tested positive for COVID-19 on 4/16 during an urgent care visit, in the ER this admission she was found to have acute hypoxic respiratory failure due to COVID-19 pneumonia and admitted to the hospital.   Subjective:   Patient in bed, appears comfortable, denies any headache, no fever, no chest pain or pressure, she still has a nagging cough but improved shortness of breath , no abdominal pain. No focal weakness.    Assessment  & Plan :     1. Acute Hypoxic Resp. Failure due to Acute Covid 19 Viral Pneumonitis during the ongoing 2020 Covid 19 Pandemic - she has severe disease with intense hypoxia requiring 6 to 8 L of oxygen and increased work of breathing, was treated appropriately with combination of IV steroids, remdesivir and Actemra which she received on 06/28/2019.  Hypoxia has improved, continues to have cough for which she will be treated, we will continue to monitor closely her inflammatory markers and clinically.  Encouraged the patient to sit up in chair in the daytime use I-S and flutter valve for pulmonary toiletry and then prone in bed when at night.  Will advance activity and titrate down oxygen as possible.    SpO2: 93 %  O2 Flow Rate (L/min): 4 L/min  Recent Labs  Lab 06/27/19 2321 06/28/19 0339 06/29/19 0824  CRP 11.5*  --   --   DDIMER 0.59*  --  0.39  BNP  --  22.4  --   PROCALCITON <0.10  --   --     Hepatic  Function Latest Ref Rng & Units 06/29/2019 06/27/2019 08/24/2018  Total Protein 6.5 - 8.1 g/dL 6.4(L) 6.9 6.8  Albumin 3.5 - 5.0 g/dL 2.8(L) 3.2(L) 3.7  AST 15 - 41 U/L _0 ALT 0 - 44 U/L 45(H) 32 32  Alk Phosphatase 38 - 126 U/L 43 40 46  Total Bilirubin 0.3 - 1.2 mg/dL 0.4 0.7 0.4    2.  Obesity with BMI of 40.  Follow with PCP.  3.  Mild intermittent asthma.  Currently stable.  On scheduled and as needed albuterol inhalations.   4.  Few bibasilar crackles and mild orthopnea.  IV Lasix on 06/29/2019 and monitor.  Outpatient echogram.    Condition - Extremely Guarded  Family Communication  : Mother Velva Harman [phone] on 06/28/19 - 2.45pm   Code Status : Full  Diet :   Diet Order            Diet Heart Room service appropriate? Yes; Fluid consistency: Thin  Diet effective now               Disposition Plan  : Stay inpatient in the hospital for treatment of acute hypoxic respiratory failure caused by COVID-19 pneumonia.  Consults  :  None  Procedures  :  None  PUD Prophylaxis :   DVT Prophylaxis  :  Lovenox   Lab Results  Component Value Date   PLT 479 (H) 06/29/2019    Inpatient Medications  Scheduled Meds: . albuterol  2 puff Inhalation Q6H  . vitamin C  500 mg Oral Daily  . chlorpheniramine-HYDROcodone  5 mL Oral Q12H  . enoxaparin (LOVENOX) injection  50 mg Subcutaneous Daily  . furosemide  60 mg Intravenous Once  . mouth rinse  15 mL Mouth Rinse BID  . methylPREDNISolone (SOLU-MEDROL) injection  60 mg Intravenous BID  . mometasone-formoterol  2 puff Inhalation BID  . zinc sulfate  220 mg Oral Daily   Continuous Infusions: . sodium chloride    . diphenhydrAMINE    . remdesivir 100 mg in NS 100 mL 100 mg (06/29/19 0805)   PRN Meds:.sodium  chloride, acetaminophen, diphenhydrAMINE, guaiFENesin-codeine, polyvinyl alcohol  Antibiotics  :    Anti-infectives (From admission, onward)   Start     Dose/Rate Route Frequency Ordered Stop   06/29/19 1000  remdesivir 100 mg in sodium chloride 0.9 % 100 mL IVPB     100 mg 200 mL/hr over 30 Minutes Intravenous Daily 06/28/19 0128 07/03/19 0959   06/28/19 0330  remdesivir 100 mg in sodium chloride 0.9 % 100 mL IVPB     100 mg 200 mL/hr over 30 Minutes Intravenous Every 30 min 06/28/19 0321 06/28/19 0525   06/28/19 0130  remdesivir 100 mg in sodium chloride 0.9 % 100 mL IVPB     100 mg 200 mL/hr over 30 Minutes Intravenous Every 30 min 06/28/19 0128 06/28/19 0229       Time Spent in minutes  30   Lala Lund M.D on 06/29/2019 at 9:12 AM  To page go to [email or web address] - password [email or web address]  [facility] -  Office  [phone]  See all Orders from today for further details    Objective:   Vitals:   06/29/19 0400 06/29/19 0430 06/29/19 0600 06/29/19 0800  BP: 118/84   116/84  Pulse: 72 77 70 75  Resp: (!) 35 (!) 26 (!) 22 20  Temp:    98.3 F (36.8 C)  TempSrc:    Oral  SpO2: 95%  93% 90% 93%  Weight:      Height:        Wt Readings from Last 3 Encounters:  06/28/19 106.6 kg  06/20/19 122.5 kg  08/24/18 99.8 kg     Intake/Output Summary (Last 24 hours) at 06/29/2019 0912 Last data filed at 06/29/2019 7001 Gross per 24 hour  Intake 340 ml  Output 900 ml  Net -560 ml     Physical Exam  Awake Alert, No new F.N deficits, Normal affect [place].AT,PERRAL Supple Neck,No JVD, No cervical lymphadenopathy appriciated.  Symmetrical Chest wall movement, Good air movement bilaterally, few bibasilar crackles RRR,No Gallops, Rubs or new Murmurs, No Parasternal Heave +ve B.Sounds, Abd Soft, No tenderness, No organomegaly appriciated, No rebound - guarding or rigidity. No Cyanosis, Clubbing or edema, No new Rash or bruise     Data Review:    CBC Recent Labs  Lab  06/27/19 2321 06/29/19 0824  WBC 10.7* 10.7*  HGB 12.4 11.6*  HCT 39.6 37.6  PLT 338 479*  MCV 92.7 92.2  MCH 29.0 28.4  MCHC 31.3 30.9  RDW 14.1 13.7  LYMPHSABS 0.7 PENDING  MONOABS 0.3 PENDING  EOSABS 0.0 PENDING  BASOSABS 0.0 PENDING    Chemistries  Recent Labs  Lab 06/27/19 2321 06/28/19 0339 06/29/19 0824  NA 142  --  142  K 4.1  --  4.3  CL 106  --  104  CO2 26  --  28  GLUCOSE 124*  --  138*  BUN 10  --  18  CREATININE 0.55  --  0.66  CALCIUM 8.7*  --  8.7*  AST 25  --  28  ALT 32  --  45*  ALKPHOS 40  --  43  BILITOT 0.7  --  0.4  MG  --  2.4 2.3     ------------------------------------------------------------------------------------------------------------------ Recent Labs    06/27/19 2321  TRIG 80    Lab Results  Component Value Date   HGBA1C 5.6% 05/30/2017   ------------------------------------------------------------------------------------------------------------------ No results for input(s): TSH, T4TOTAL, T3FREE, THYROIDAB in the last 72 hours.  Invalid input(s): FREET3  Cardiac Enzymes No results for input(s): CKMB, TROPONINI, MYOGLOBIN in the last 168 hours.  Invalid input(s): CK ------------------------------------------------------------------------------------------------------------------    Component Value Date/Time   BNP 22.4 06/28/2019 7494    Micro Results Recent Results (from the past 240 hour(s))  SARS CORONAVIRUS 2 (TAT 6-24 HRS) Nasopharyngeal Nasopharyngeal Swab     Status: Abnormal   Collection Time: 06/20/19  2:48 PM   Specimen: Nasopharyngeal Swab  Result Value Ref Range Status   SARS Coronavirus 2 POSITIVE (A) NEGATIVE Final    Comment: EMAILED [place] ON 06/21/2019 (NOTE) SARS-CoV-2 target nucleic acids are DETECTED. The SARS-CoV-2 RNA is generally detectable in upper and lower respiratory specimens during the acute phase of infection. Positive results are indicative of the presence of  SARS-CoV-2 RNA. Clinical correlation with patient history and other diagnostic information is  necessary to determine patient infection status. Positive results do not rule out bacterial infection or co-infection with other viruses.  The expected result is Negative. Fact Sheet for Patients: SugarRoll.be Fact Sheet for Healthcare Providers: https://www.woods-mathews.com/ This test is not yet approved or cleared by the [place] FDA and  has been authorized for detection and/or diagnosis of SARS-CoV-2 by FDA under an Emergency Use Authorization (EUA). This EUA will remain  in effect (meaning this test can be used) for the duration of the COVID-19 declaratio n under Section  564(b)(1) of the Act, 21 U.S.C. section 360bbb-3(b)(1), unless the authorization is terminated or revoked sooner. Performed at [facility], [place] [address], [place], [place] [address]   MRSA PCR Screening     Status: None   Collection Time: 06/29/19  3:47 AM   Specimen: Nasal Mucosa; Nasopharyngeal  Result Value Ref Range Status   MRSA by PCR NEGATIVE NEGATIVE Final    Comment:        The GeneXpert MRSA Assay (FDA approved for NASAL specimens only), is one component of a comprehensive MRSA colonization surveillance program. It is not intended to diagnose MRSA infection nor to guide or monitor treatment for MRSA infections. Performed at [facility], [place] [address] [place], [place] [address]     Radiology Reports DG Chest [place] 1 View  Result Date: 06/27/2019 CLINICAL DATA:  Shortness of breath EXAM: PORTABLE CHEST 1 VIEW COMPARISON:  Radiograph 06/23/2019 FINDINGS: Lung volumes are chronically diminished with nonspecific elevation of the left hemidiaphragm which is similar to comparison studies. However, there is new areas of mixed interstitial and consolidative opacity predominantly within the lung bases and particular within the left lung periphery. No  pneumothorax is evident. Obscuration of the left costophrenic sulcus could suggest trace pleural fluid. Prominence of the cardiomediastinal silhouette may be related to the low volumes and portable technique. No acute osseous or soft tissue abnormality. Telemetry leads overlie the chest. IMPRESSION: Findings are concerning for multifocal pneumonia markedly worsened from comparison exam 06/23/2019. Electronically Signed   By: Lovena Le M.D.   On: 06/27/2019 22:01   DG Chest Port 1 View  Result Date: 06/23/2019 CLINICAL DATA:  44 year old female with fever. EXAM: PORTABLE CHEST 1 VIEW COMPARISON:  Chest radiograph dated 08/24/2018. FINDINGS: There is mild eventration of the left hemidiaphragm with minimal left lung base atelectasis. No focal consolidation, pleural effusion, pneumothorax. The cardiac silhouette patch that stable cardiac silhouette. No acute osseous pathology. IMPRESSION: No active disease. Electronically Signed   By: Anner Crete M.D.   On: 06/23/2019 18:55

## 2019-06-29 NOTE — Progress Notes (Signed)
   06/28/19 2059  Assess: MEWS Score  Pulse Rate (!) 122  ECG Heart Rate (!) 122  Resp (!) 41  SpO2 (!) 89 %  O2 Device HFNC  Patient Activity (if Appropriate) Other (Comment) (Standing, transferring from chair to bed.)  O2 Flow Rate (L/min) 5 L/min  Assess: MEWS Score  MEWS Temp 0  MEWS Systolic 0  MEWS Pulse 2  MEWS RR 3  MEWS LOC 0  MEWS Score 5  MEWS Score Color Red  Assess: if the MEWS score is Yellow or Red  Were vital signs taken at a resting state? No  Early Detection of Sepsis Score *See Row Information* Low  MEWS guidelines implemented *See Row Information* No, vital signs rechecked  Treat  MEWS Interventions Other (Comment) (Rest)   Patient transferred from chair to bed with standby assist. Patient stood for a few minutes stating that she was trying to catch her breath SpO2 decreased to as low as 89% on O2 5L HFNC. HHR increased to as high as 120s. Patient SOB and noisy inspirations noted. HR decreased to 110s, then 100s. Patient sat on EOB with SpO2 quickly increasing to 93% on O2 5L. Patient eventually laid down in bed to rest. Respiration mostly 30s through the shift, even resting. SpO2 decreased to 89% on O2 4L  A few times while resting but mostly maintained above 90% on O2 4L. Will continue to monitor.

## 2019-06-30 ENCOUNTER — Inpatient Hospital Stay (HOSPITAL_COMMUNITY): Payer: HRSA Program

## 2019-06-30 DIAGNOSIS — I5033 Acute on chronic diastolic (congestive) heart failure: Secondary | ICD-10-CM

## 2019-06-30 LAB — CBC WITH DIFFERENTIAL/PLATELET
Abs Immature Granulocytes: 0.12 10*3/uL — ABNORMAL HIGH (ref 0.00–0.07)
Basophils Absolute: 0 10*3/uL (ref 0.0–0.1)
Basophils Relative: 0 %
Eosinophils Absolute: 0 10*3/uL (ref 0.0–0.5)
Eosinophils Relative: 0 %
HCT: 39.2 % (ref 36.0–46.0)
Hemoglobin: 12.1 g/dL (ref 12.0–15.0)
Immature Granulocytes: 1 %
Lymphocytes Relative: 13 %
Lymphs Abs: 1.5 10*3/uL (ref 0.7–4.0)
MCH: 28.7 pg (ref 26.0–34.0)
MCHC: 30.9 g/dL (ref 30.0–36.0)
MCV: 93.1 fL (ref 80.0–100.0)
Monocytes Absolute: 0.8 10*3/uL (ref 0.1–1.0)
Monocytes Relative: 7 %
Neutro Abs: 8.7 10*3/uL — ABNORMAL HIGH (ref 1.7–7.7)
Neutrophils Relative %: 79 %
Platelets: 561 10*3/uL — ABNORMAL HIGH (ref 150–400)
RBC: 4.21 MIL/uL (ref 3.87–5.11)
RDW: 13.7 % (ref 11.5–15.5)
WBC: 11.1 10*3/uL — ABNORMAL HIGH (ref 4.0–10.5)
nRBC: 0 % (ref 0.0–0.2)

## 2019-06-30 LAB — COMPREHENSIVE METABOLIC PANEL
ALT: 41 U/L (ref 0–44)
AST: 21 U/L (ref 15–41)
Albumin: 3 g/dL — ABNORMAL LOW (ref 3.5–5.0)
Alkaline Phosphatase: 43 U/L (ref 38–126)
Anion gap: 8 (ref 5–15)
BUN: 25 mg/dL — ABNORMAL HIGH (ref 6–20)
CO2: 30 mmol/L (ref 22–32)
Calcium: 8.8 mg/dL — ABNORMAL LOW (ref 8.9–10.3)
Chloride: 102 mmol/L (ref 98–111)
Creatinine, Ser: 0.56 mg/dL (ref 0.44–1.00)
GFR calc Af Amer: >60 mL/min (ref >60)
GFR calc non Af Amer: >60 mL/min (ref >60)
Glucose, Bld: 164 mg/dL — ABNORMAL HIGH (ref 70–99)
Potassium: 4.1 mmol/L (ref 3.5–5.1)
Sodium: 140 mmol/L (ref 135–145)
Total Bilirubin: 0.2 mg/dL — ABNORMAL LOW (ref 0.3–1.2)
Total Protein: 6.5 g/dL (ref 6.5–8.1)

## 2019-06-30 LAB — C-REACTIVE PROTEIN: CRP: 1.4 mg/dL — ABNORMAL HIGH (ref <1.0)

## 2019-06-30 LAB — PROCALCITONIN: Procalcitonin: <0.1 ng/mL

## 2019-06-30 LAB — BRAIN NATRIURETIC PEPTIDE: B Natriuretic Peptide: 27.6 pg/mL (ref 0.0–100.0)

## 2019-06-30 LAB — MAGNESIUM: Magnesium: 2.3 mg/dL (ref 1.7–2.4)

## 2019-06-30 LAB — TROPONIN I (HIGH SENSITIVITY): Troponin I (High Sensitivity): 5 ng/L (ref <18)

## 2019-06-30 LAB — ECHOCARDIOGRAM COMPLETE
Height: 64 in
Weight: 3760 oz

## 2019-06-30 LAB — D-DIMER, QUANTITATIVE: D-Dimer, Quant: <0.27 ug/mL-FEU (ref 0.00–0.50)

## 2019-06-30 MED ORDER — ZOLPIDEM TARTRATE 5 MG PO TABS
5.0000 mg | ORAL_TABLET | Freq: Every evening | ORAL | Status: DC | PRN
  Administered 2019-06-30 – 2019-07-01 (×2): 5 mg via ORAL
  Filled 2019-06-30 (×2): qty 1

## 2019-06-30 MED ORDER — PERFLUTREN LIPID MICROSPHERE
1.0000 mL | INTRAVENOUS | Status: AC | PRN
  Administered 2019-06-30: 2 mL via INTRAVENOUS
  Filled 2019-06-30: qty 10

## 2019-06-30 MED ORDER — IOHEXOL 350 MG/ML SOLN
75.0000 mL | Freq: Once | INTRAVENOUS | Status: AC | PRN
  Administered 2019-06-30: 75 mL via INTRAVENOUS

## 2019-06-30 MED ORDER — DICLOFENAC SODIUM 1 % EX GEL
2.0000 g | Freq: Four times a day (QID) | CUTANEOUS | Status: DC
  Administered 2019-06-30 – 2019-07-02 (×8): 2 g via TOPICAL
  Filled 2019-06-30: qty 100

## 2019-06-30 NOTE — Progress Notes (Signed)
CT ordered Stat. Pt washing up at this time and refuses new IV until she is done. Education provided on reasoning for CT and that's the MD wanted it STAT. She understand and reports "I just have to wipe myself up first".

## 2019-06-30 NOTE — Progress Notes (Signed)
PROGRESS NOTE                                                                                                                                                                                                             Patient Demographics:    Tracy Rosales, is a 44 y.o. female, DOB - May 05, 1975, ZHQ:[id number]  Outpatient Primary MD for the patient is Patient, No Pcp Per    LOS - 2  Admit date - 06/27/2019    Chief Complaint  Patient presents with  . Shortness of Breath    COVID +       Brief Narrative - Tracy Rosales is a 44 y.o. female with medical history significant of asthma, bronchitis, depression, morbid obesity (BMI 46.35) presenting with complaints of shortness of breath.  She recently tested positive for COVID-19 on 4/16 during an urgent care visit, in the ER this admission she was found to have acute hypoxic respiratory failure due to COVID-19 pneumonia and admitted to the hospital.   Subjective:   Patient in bed, appears comfortable, denies any headache, no fever, she is having left-sided pleuritic chest pain on taking deep breaths since last night, no shortness of breath , no abdominal pain. No focal weakness.   Assessment  & Plan :     1. Acute Hypoxic Resp. Failure due to Acute Covid 19 Viral Pneumonitis during the ongoing 2020 Covid 19 Pandemic - she has severe disease with intense hypoxia requiring 6 to 8 L of oxygen and increased work of breathing, was treated appropriately with combination of IV steroids, remdesivir and Actemra which she received on 06/28/2019.  Hypoxia has improved, continues to have cough for which she will be treated, we will continue to monitor closely her inflammatory markers and clinically.  Encouraged the patient to sit up in chair in the daytime use I-S and flutter valve for pulmonary toiletry and then prone in bed when at night.  Will advance activity and titrate down oxygen as  possible.    SpO2: 92 % O2 Flow Rate (L/min): 1 L/min  Recent Labs  Lab 06/27/19 2321 06/28/19 0339 06/29/19 0824 06/30/19 0407  CRP 11.5*  --  2.8* 1.4*  DDIMER 0.59*  --  0.39 <0.27  BNP  --  22.4 56.8 27.6  PROCALCITON <0.10  --  <0.10 <0.10  Hepatic Function Latest Ref Rng & Units 06/30/2019 06/29/2019 06/27/2019  Total Protein 6.5 - 8.1 g/dL 6.5 6.4(L) 6.9  Albumin 3.5 - 5.0 g/dL 3.0(L) 2.8(L) 3.2(L)  AST 15 - 41 U/L '21 28 25  ' ALT 0 - 44 U/L 41 45(H) 32  Alk Phosphatase 38 - 126 U/L 43 43 40  Total Bilirubin 0.3 - 1.2 mg/dL 0.2(L) 0.4 0.7    2.  Obesity with BMI of 40.  Follow with PCP.  3.  Mild intermittent asthma.  Currently stable.  On scheduled and as needed albuterol inhalations.   4.  Few bibasilar crackles and mild orthopnea.  IV Lasix on 06/29/2019 and monitor.  Outpatient echogram.  5.  Left-sided pleuritic chest pain starting night of 06/29/2019.  EKG nonacute, high-sensitivity troponin negative, will check CTA to rule out PE, will also check echo to rule out myocarditis chances of which are very low with a negative troponin, for now supportive care, added Voltaren gel, she most likely has pulled a muscle from cough.    Condition - Extremely Guarded  Family Communication  : Mother Velva Harman [phone] on 06/28/19 - 2.45pm   Code Status : Full  Diet :   Diet Order            Diet Heart Room service appropriate? Yes; Fluid consistency: Thin  Diet effective now               Disposition Plan  : Stay inpatient in the hospital for treatment of acute hypoxic respiratory failure caused by COVID-19 pneumonia.  Consults  :  None  Procedures  :  None  PUD Prophylaxis :   DVT Prophylaxis  :  Lovenox   Lab Results  Component Value Date   PLT 561 (H) 06/30/2019    Inpatient Medications  Scheduled Meds: . albuterol  2 puff Inhalation Q6H  . vitamin C  500 mg Oral Daily  . chlorpheniramine-HYDROcodone  5 mL Oral Q12H  . diclofenac Sodium  2 g  Topical QID  . enoxaparin (LOVENOX) injection  50 mg Subcutaneous Daily  . mouth rinse  15 mL Mouth Rinse BID  . methylPREDNISolone (SOLU-MEDROL) injection  60 mg Intravenous BID  . mometasone-formoterol  2 puff Inhalation BID  . zinc sulfate  220 mg Oral Daily   Continuous Infusions: . sodium chloride    . remdesivir 100 mg in NS 100 mL 100 mg (06/30/19 0724)   PRN Meds:.sodium chloride, acetaminophen, guaiFENesin-codeine, polyvinyl alcohol, zolpidem  Antibiotics  :    Anti-infectives (From admission, onward)   Start     Dose/Rate Route Frequency Ordered Stop   06/29/19 1000  remdesivir 100 mg in sodium chloride 0.9 % 100 mL IVPB     100 mg 200 mL/hr over 30 Minutes Intravenous Daily 06/28/19 0128 07/03/19 0959   06/28/19 0330  remdesivir 100 mg in sodium chloride 0.9 % 100 mL IVPB     100 mg 200 mL/hr over 30 Minutes Intravenous Every 30 min 06/28/19 0321 06/28/19 0525   06/28/19 0130  remdesivir 100 mg in sodium chloride 0.9 % 100 mL IVPB     100 mg 200 mL/hr over 30 Minutes Intravenous Every 30 min 06/28/19 0128 06/28/19 0229       Time Spent in minutes  30   Lala Lund M.D on 06/30/2019 at 11:23 AM  To page go to [email or web address] - password [email or web address]  [facility] -  Office  [phone]  See all Orders from today for further details  Objective:   Vitals:   06/30/19 0318 06/30/19 0400 06/30/19 0500 06/30/19 0725  BP:  110/71  108/75  Pulse: 72 75 67 72  Resp: (!) 26 (!) 24 (!) 21 (!) 21  Temp:  97.8 F (36.6 C)  98.7 F (37.1 C)  TempSrc:  Oral  Oral  SpO2: 94% 96% 95% 92%  Weight:      Height:        Wt Readings from Last 3 Encounters:  06/28/19 106.6 kg  06/20/19 122.5 kg  08/24/18 99.8 kg     Intake/Output Summary (Last 24 hours) at 06/30/2019 1123 Last data filed at 06/29/2019 2300 Gross per 24 hour  Intake 600 ml  Output 1500 ml  Net -900 ml     Physical Exam  Awake Alert, No new F.N deficits, Normal affect [place].AT,PERRAL Supple  Neck,No JVD, No cervical lymphadenopathy appriciated.  Symmetrical Chest wall movement, Good air movement bilaterally, CTAB RRR,No Gallops, Rubs or new Murmurs, No Parasternal Heave +ve B.Sounds, Abd Soft, No tenderness, No organomegaly appriciated, No rebound - guarding or rigidity. No Cyanosis, Clubbing or edema, No new Rash or bruise    Data Review:    CBC Recent Labs  Lab 06/27/19 2321 06/29/19 0824 06/30/19 0407  WBC 10.7* 10.7* 11.1*  HGB 12.4 11.6* 12.1  HCT 39.6 37.6 39.2  PLT 338 479* 561*  MCV 92.7 92.2 93.1  MCH 29.0 28.4 28.7  MCHC 31.3 30.9 30.9  RDW 14.1 13.7 13.7  LYMPHSABS 0.7 1.5 1.5  MONOABS 0.3 0.8 0.8  EOSABS 0.0 0.0 0.0  BASOSABS 0.0 0.0 0.0    Chemistries  Recent Labs  Lab 06/27/19 2321 06/28/19 0339 06/29/19 0824 06/30/19 0407  NA 142  --  142 140  K 4.1  --  4.3 4.1  CL 106  --  104 102  CO2 26  --  28 30  GLUCOSE 124*  --  138* 164*  BUN 10  --  18 25*  CREATININE 0.55  --  0.66 0.56  CALCIUM 8.7*  --  8.7* 8.8*  AST 25  --  28 21  ALT 32  --  45* 41  ALKPHOS 40  --  43 43  BILITOT 0.7  --  0.4 0.2*  MG  --  2.4 2.3 2.3     ------------------------------------------------------------------------------------------------------------------ Recent Labs    06/27/19 2321  TRIG 80    Lab Results  Component Value Date   HGBA1C 5.6% 05/30/2017   ------------------------------------------------------------------------------------------------------------------ No results for input(s): TSH, T4TOTAL, T3FREE, THYROIDAB in the last 72 hours.  Invalid input(s): FREET3  Cardiac Enzymes No results for input(s): CKMB, TROPONINI, MYOGLOBIN in the last 168 hours.  Invalid input(s): CK ------------------------------------------------------------------------------------------------------------------    Component Value Date/Time   BNP 27.6 06/30/2019 0407    Micro Results Recent Results (from the past 240 hour(s))  SARS CORONAVIRUS 2  (TAT 6-24 HRS) Nasopharyngeal Nasopharyngeal Swab     Status: Abnormal   Collection Time: 06/20/19  2:48 PM   Specimen: Nasopharyngeal Swab  Result Value Ref Range Status   SARS Coronavirus 2 POSITIVE (A) NEGATIVE Final    Comment: EMAILED [place] ON 06/21/2019 (NOTE) SARS-CoV-2 target nucleic acids are DETECTED. The SARS-CoV-2 RNA is generally detectable in upper and lower respiratory specimens during the acute phase of infection. Positive results are indicative of the presence of SARS-CoV-2 RNA. Clinical correlation with patient history and other diagnostic information is  necessary to determine patient infection status. Positive results  do not rule out bacterial infection or co-infection with other viruses.  The expected result is Negative. Fact Sheet for Patients: SugarRoll.be Fact Sheet for Healthcare Providers: https://www.woods-mathews.com/ This test is not yet approved or cleared by the [place] FDA and  has been authorized for detection and/or diagnosis of SARS-CoV-2 by FDA under an Emergency Use Authorization (EUA). This EUA will remain  in effect (meaning this test can be used) for the duration of the COVID-19 declaratio n under Section 564(b)(1) of the Act, 21 U.S.C. section 360bbb-3(b)(1), unless the authorization is terminated or revoked sooner. Performed at [facility], [place] [address], [place], [place] [address]   Blood Culture (routine x 2)     Status: None (Preliminary result)   Collection Time: 06/27/19 11:21 PM   Specimen: BLOOD  Result Value Ref Range Status   Specimen Description BLOOD RIGHT ANTECUBITAL  Final   Special Requests   Final    BOTTLES DRAWN AEROBIC AND ANAEROBIC Blood Culture adequate volume   Culture   Final    NO GROWTH 2 DAYS Performed at [facility], [place] [address], [place], [place] [address]    Report Status PENDING  Incomplete  Blood Culture (routine x 2)      Status: None (Preliminary result)   Collection Time: 06/28/19 12:00 AM   Specimen: BLOOD RIGHT ARM  Result Value Ref Range Status   Specimen Description BLOOD RIGHT ARM  Final   Special Requests   Final    BOTTLES DRAWN AEROBIC AND ANAEROBIC Blood Culture adequate volume   Culture   Final    NO GROWTH 2 DAYS Performed at [facility], [place] [address], [place], [place] [address]    Report Status PENDING  Incomplete  MRSA PCR Screening     Status: None   Collection Time: 06/29/19  3:47 AM   Specimen: Nasal Mucosa; Nasopharyngeal  Result Value Ref Range Status   MRSA by PCR NEGATIVE NEGATIVE Final    Comment:        The GeneXpert MRSA Assay (FDA approved for NASAL specimens only), is one component of a comprehensive MRSA colonization surveillance program. It is not intended to diagnose MRSA infection nor to guide or monitor treatment for MRSA infections. Performed at [facility], [place] [address] [place], [place] [address]     Radiology Reports DG Chest [place] 1 View  Result Date: 06/27/2019 CLINICAL DATA:  Shortness of breath EXAM: PORTABLE CHEST 1 VIEW COMPARISON:  Radiograph 06/23/2019 FINDINGS: Lung volumes are chronically diminished with nonspecific elevation of the left hemidiaphragm which is similar to comparison studies. However, there is new areas of mixed interstitial and consolidative opacity predominantly within the lung bases and particular within the left lung periphery. No pneumothorax is evident. Obscuration of the left costophrenic sulcus could suggest trace pleural fluid. Prominence of the cardiomediastinal silhouette may be related to the low volumes and portable technique. No acute osseous or soft tissue abnormality. Telemetry leads overlie the chest. IMPRESSION: Findings are concerning for multifocal pneumonia markedly worsened from comparison exam 06/23/2019. Electronically Signed   By: Lovena Le M.D.   On: 06/27/2019 22:01   DG Chest Port 1  View  Result Date: 06/23/2019 CLINICAL DATA:  44 year old female with fever. EXAM: PORTABLE CHEST 1 VIEW COMPARISON:  Chest radiograph dated 08/24/2018. FINDINGS: There is mild eventration of the left hemidiaphragm with minimal left lung base atelectasis. No focal consolidation, pleural effusion, pneumothorax. The cardiac silhouette patch that stable cardiac  silhouette. No acute osseous pathology. IMPRESSION: No active disease. Electronically Signed   By: Anner Crete M.D.   On: 06/23/2019 18:55

## 2019-06-30 NOTE — Progress Notes (Signed)
   06/29/19 2156  Assess: MEWS Score  Pulse Rate 96  ECG Heart Rate 93  Resp (!) 47  SpO2 (!) 86 %  O2 Device Nasal Cannula  Patient Activity (if Appropriate) In bed  O2 Flow Rate (L/min) 2 L/min  Assess: MEWS Score  MEWS Temp 0  MEWS Systolic 0  MEWS Pulse 0  MEWS RR 3  MEWS LOC 0  MEWS Score 3  MEWS Score Color Yellow  Assess: if the MEWS score is Yellow or Red  Were vital signs taken at a resting state? Yes  Early Detection of Sepsis Score *See Row Information* Low  MEWS guidelines implemented *See Row Information* No, previously yellow, continue vital signs every 4 hours   Patient was resting in bed, SpO2 maintaining 93-94% on O2 2L [place], but patient continues to c/o SOB and wanting more O2. Patient tolerating talking on the phone. Patient had ordered [employer] food. While eating, SpO2 dropped to 86-88% on O2 2L. O2 increased to 4L  to maintain O2 above 90%. Patient was having labored breathing with noisy inspirations. Patient encouraged to try to slow respirations. Patient has been sleeping well and maintaining SpO2 around 98% on O2 4L, so O2 gradually decreased to 2L. SpO2 95% on O2 2L  while resting in bed at this time, no distress noted. Will continue to monitor.

## 2019-06-30 NOTE — Progress Notes (Signed)
  Echocardiogram 2D Echocardiogram has been performed.  Tracy Rosales 06/30/2019, 3:47 PM

## 2019-06-30 NOTE — Progress Notes (Signed)
Patient has slept most of the shift. SpO2 as maintained 92% and above on O2 1L [place]. Patient resting in bed at this time, no distress noted. Will continue to monitor.

## 2019-07-01 LAB — COMPREHENSIVE METABOLIC PANEL
ALT: 36 U/L (ref 0–44)
AST: 17 U/L (ref 15–41)
Albumin: 3 g/dL — ABNORMAL LOW (ref 3.5–5.0)
Alkaline Phosphatase: 38 U/L (ref 38–126)
Anion gap: 7 (ref 5–15)
BUN: 21 mg/dL — ABNORMAL HIGH (ref 6–20)
CO2: 29 mmol/L (ref 22–32)
Calcium: 8.8 mg/dL — ABNORMAL LOW (ref 8.9–10.3)
Chloride: 104 mmol/L (ref 98–111)
Creatinine, Ser: 0.71 mg/dL (ref 0.44–1.00)
GFR calc Af Amer: >60 mL/min (ref >60)
GFR calc non Af Amer: >60 mL/min (ref >60)
Glucose, Bld: 137 mg/dL — ABNORMAL HIGH (ref 70–99)
Potassium: 4.4 mmol/L (ref 3.5–5.1)
Sodium: 140 mmol/L (ref 135–145)
Total Bilirubin: 0.7 mg/dL (ref 0.3–1.2)
Total Protein: 6 g/dL — ABNORMAL LOW (ref 6.5–8.1)

## 2019-07-01 LAB — CBC WITH DIFFERENTIAL/PLATELET
Abs Immature Granulocytes: 0.18 10*3/uL — ABNORMAL HIGH (ref 0.00–0.07)
Basophils Absolute: 0 10*3/uL (ref 0.0–0.1)
Basophils Relative: 0 %
Eosinophils Absolute: 0 10*3/uL (ref 0.0–0.5)
Eosinophils Relative: 0 %
HCT: 39.6 % (ref 36.0–46.0)
Hemoglobin: 12.4 g/dL (ref 12.0–15.0)
Immature Granulocytes: 1 %
Lymphocytes Relative: 16 %
Lymphs Abs: 2 10*3/uL (ref 0.7–4.0)
MCH: 29.2 pg (ref 26.0–34.0)
MCHC: 31.3 g/dL (ref 30.0–36.0)
MCV: 93.2 fL (ref 80.0–100.0)
Monocytes Absolute: 1 10*3/uL (ref 0.1–1.0)
Monocytes Relative: 8 %
Neutro Abs: 9.3 10*3/uL — ABNORMAL HIGH (ref 1.7–7.7)
Neutrophils Relative %: 75 %
Platelets: 518 10*3/uL — ABNORMAL HIGH (ref 150–400)
RBC: 4.25 MIL/uL (ref 3.87–5.11)
RDW: 13.5 % (ref 11.5–15.5)
WBC: 12.6 10*3/uL — ABNORMAL HIGH (ref 4.0–10.5)
nRBC: 0 % (ref 0.0–0.2)

## 2019-07-01 LAB — BRAIN NATRIURETIC PEPTIDE: B Natriuretic Peptide: 25.2 pg/mL (ref 0.0–100.0)

## 2019-07-01 LAB — C-REACTIVE PROTEIN: CRP: 0.8 mg/dL (ref <1.0)

## 2019-07-01 LAB — MAGNESIUM: Magnesium: 2.1 mg/dL (ref 1.7–2.4)

## 2019-07-01 LAB — D-DIMER, QUANTITATIVE: D-Dimer, Quant: 0.29 ug/mL-FEU (ref 0.00–0.50)

## 2019-07-01 LAB — PROCALCITONIN: Procalcitonin: <0.1 ng/mL

## 2019-07-01 MED ORDER — METHYLPREDNISOLONE SODIUM SUCC 40 MG IJ SOLR
40.0000 mg | Freq: Every day | INTRAMUSCULAR | Status: DC
  Administered 2019-07-01: 40 mg via INTRAVENOUS
  Filled 2019-07-01: qty 1

## 2019-07-01 MED ORDER — METHYLPREDNISOLONE SODIUM SUCC 40 MG IJ SOLR
20.0000 mg | Freq: Every day | INTRAMUSCULAR | Status: DC
  Administered 2019-07-02: 20 mg via INTRAVENOUS
  Filled 2019-07-01: qty 1

## 2019-07-01 NOTE — Progress Notes (Signed)
   06/30/19 2014  Assess: MEWS Score  Pulse Rate (!) 117  ECG Heart Rate (!) 117  Resp (!) 37  SpO2 (!) 88 %  O2 Device Nasal Cannula  Patient Activity (if Appropriate) Other (Comment) (Standing.)  O2 Flow Rate (L/min) 1 L/min  Assess: MEWS Score  MEWS Temp 0  MEWS Systolic 0  MEWS Pulse 2  MEWS RR 3  MEWS LOC 0  MEWS Score 5  MEWS Score Color Red  Assess: if the MEWS score is Yellow or Red  Were vital signs taken at a resting state? No  Focused Assessment Documented focused assessment  Early Detection of Sepsis Score *See Row Information* Low  MEWS guidelines implemented *See Row Information* No, vital signs rechecked   Patient OOB, standing approximately 15 minutes. Patient tolerated well. SpO2 maintained 88-93% on O2 1L [place] during this activity. Patient had mild SOB, but no distress noted. Patient is back to bed at this time and O2 is turned off to trial patient on RA. SpO2 90% on RA while patient resting in bed at this time. This RN encouraged patient to use IS and flutter valve, and to walk around room and to bathroom instead of using Purewick. Patient's gait is steady, but patient encouraged to use call button to ask for assistance if it is needed. Will continue to monitor.

## 2019-07-01 NOTE — Evaluation (Signed)
Physical Therapy Evaluation Patient Details Name: Tracy Rosales MRN: [id number] DOB: 10-05-1975 Today's Date: 07/01/2019   History of Present Illness  44 year old female admitted 06/27/19 with worsening SOB and cough. Patient was diagnosed with COVID at urgent care 06/20/19. She presented to the ED 4/19 and CXR was clear. She was discharged home on inhalers and Prednisone. CXR this admission was concerning for multifocal PNA. Patient treated with IV steroids, Remdesivir, and Actemra 06/28/19. Patient with L sided pleuritic chest pain night of 06/29/19. EKG nonacute, high-sensitivity troponin negative. CTA negative for PE. PMH: asthma, bronchitis, depression, morbid obesity (BMI 46.35)     Clinical Impression  Patient presents with impaired activity tolerance and generalized decreased strength. She is below her PLOF of independent and working outside the home. Patient reports she does not feel ready for discharge as she is worried about readmission to the hospital. Patient desatted to 83% on room air ambulating approx 186ft in hallway. After approx 3 minutes recovery, oxygen saturation still 88%. Education on benefits of OOB to chair, flutter, incentive spirometer.  At rest at start of session on room air: O2 saturation 89%, HR 90 bpm, RR 18 Donning socks seated: HR 117 bpm, O2 sat 87% With ambulation in hallway on room air: HR up to 138 bpm, O2 sat down to 85% halfway through gait trial, 83% once back to room, RR 37, DOE noted seated recovery sitting EOB, after approx 1 minute O2 saturation 87%, HR 107 bpm, RR 22 after seated rest 3 minutes O2 saturation 88%    Follow Up Recommendations Home health PT;Supervision - Intermittent    Equipment Recommendations  Other (comment)(shower chair)       Precautions / Restrictions Precautions Precautions: Other (comment) Precaution Comments: monitor oxygen saturation Restrictions Weight Bearing Restrictions: No      Mobility  Bed Mobility Overal bed  mobility: Modified Independent  General bed mobility comments: HOB elevated, supine<>sit  Transfers Overall transfer level: Needs assistance Equipment used: None Transfers: Sit to/from Stand Sit to Stand: Supervision;Modified independent (Device/Increase time)  Ambulation/Gait Ambulation/Gait assistance: Supervision Gait Distance (Feet): 100 Feet Assistive device: None Gait Pattern/deviations: Decreased step length - right;Decreased step length - left Gait velocity: decreased   General Gait Details: Overal steady, no LOB. Patient able to manage door without LOB.  Stairs Not assessed    Balance Overall balance assessment: Mild deficits observed, not formally tested;Modified Independent      Pertinent Vitals/Pain Pain Assessment: No/denies pain    Home Living Family/patient expects to be discharged to:: Private residence Living Arrangements: Spouse/significant other;Children;Other (Comment)(68 year old son) Available Help at Discharge: Other (Comment)(patient reports she does not have anyone to help her) Type of Home: House Home Access: Stairs to enter Entrance Stairs-Rails: None Entrance Stairs-Number of Steps: 1-2 Home Layout: One level Home Equipment: None Additional Comments: Boyfriend was COVID + but is now going back to work. Son was COVID negative. 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    Prior Function Level of Independence: Independent    Comments: +working outside of the home        Extremity/Trunk Assessment    Lower Extremity Assessment Lower Extremity Assessment: Generalized weakness(BLE strength grossly 4/5)       Communication   Communication: No difficulties  Cognition Arousal/Alertness: Awake/alert Behavior During Therapy: Anxious;Flat affect Overall Cognitive Status: Within Functional Limits for tasks assessed     General Comments General comments (skin integrity, edema, etc.): On room air, oxygen saturation down to 83% with  ambulation  approx 153ft on room air, HR up to 138 bpm.     Exercises Other Exercises Other Exercises: flutter x 10 Other Exercises: incentive spirometer x8, max 582mL(patient reports difficulty with IS)   Assessment/Plan    PT Assessment Patient needs continued PT services  PT Problem List Decreased strength;Decreased activity tolerance;Decreased balance;Decreased mobility;Cardiopulmonary status limiting activity;Obesity           PT Goals (Current goals can be found in the Care Plan section)  Acute Rehab PT Goals Patient Stated Goal: to prevent rehospitalization PT Goal Formulation: With patient Time For Goal Achievement: 07/14/19 Potential to Achieve Goals: Good    Frequency Min 3X/week   Barriers to discharge   patient reports she has no one to help her       AM-PAC PT "6 Clicks" Mobility  Outcome Measure Help needed turning from your back to your side while in a flat bed without using bedrails?: None Help needed moving from lying on your back to sitting on the side of a flat bed without using bedrails?: A Little Help needed moving to and from a bed to a chair (including a wheelchair)?: None Help needed standing up from a chair using your arms (e.g., wheelchair or bedside chair)?: None Help needed to walk in hospital room?: A Little Help needed climbing 3-5 steps with a railing? : A Little 6 Click Score: 21    End of Session   Activity Tolerance: Patient limited by fatigue Patient left: in bed;with call bell/phone within reach;Other (comment)(declined OOB in chair due to fatigue) Nurse Communication: Mobility status;Other (comment)(oxygen saturation response) PT Visit Diagnosis: Unsteadiness on feet (R26.81);Other abnormalities of gait and mobility (R26.89)    Time: [id number] PT Time Calculation (min) (ACUTE ONLY): 29 min   Charges:   PT Evaluation $PT Eval Moderate Complexity: 1 Mod          Birdie Hopes, PT, DPT Acute Rehab [phone]  office    Birdie Hopes 07/01/2019, 10:08 AM

## 2019-07-01 NOTE — Progress Notes (Addendum)
   06/30/19 2339  Assess: MEWS Score  Pulse Rate (!) 113  ECG Heart Rate (!) 112  Resp (!) 27  SpO2 (!) 86 %  O2 Device Room Air  Patient Activity (if Appropriate) Other (Comment) (Patient ambulated to bathroom and back.)  Assess: MEWS Score  MEWS Temp 0  MEWS Systolic 0  MEWS Pulse 2  MEWS RR 2  MEWS LOC 0  MEWS Score 4  MEWS Score Color Red  Assess: if the MEWS score is Yellow or Red  Were vital signs taken at a resting state? No  Focused Assessment Documented focused assessment  Early Detection of Sepsis Score *See Row Information* Low  MEWS guidelines implemented *See Row Information* No, vital signs rechecked   Patient ambulated to bathroom independently, washed in front of sink, then ambulated back to bed. Patient unplugged SpO2 sensor, so SpO2 is not fully known. This RN encouraged patient to keep SpO2 sensor on to monitor SpO2 during activity. Patient encouraged to call for assistance if it is needed. Patient states that she only had mild SOB and coughing but feels better than a few days ago, and she feels safe with this increase in activity. HR did increase as high as 134 noted. Lowest SpO2 noted was 86% on RA. SpO2 mostly maintained 88-92% on RA during this activity and while patient sat on EOB. Patient sitting on EOB at this time, SpO2 91% on RA. No distress noted. Patient denies needs at this time, Will continue to monitor.

## 2019-07-01 NOTE — Progress Notes (Addendum)
                                      [facility]                            [place], [place] [address]      Tracy Rosales was admitted to the Hospital on 06/27/2019 and Discharged  07/02/2019 .  She can return to work without any restrictions after 07/17/2019.  Call Lala Lund MD, [facility]  [phone] with questions.  Lala Lund M.D on 07/02/2019,at 7:12 AM  [facility]   Office  [phone]

## 2019-07-01 NOTE — Progress Notes (Signed)
   07/01/19 0200  Assess: MEWS Score  Pulse Rate 76  ECG Heart Rate 75  Resp (!) 29  SpO2 90 %  O2 Device Room Air  Patient Activity (if Appropriate) In bed  Assess: MEWS Score  MEWS Temp 0  MEWS Systolic 0  MEWS Pulse 0  MEWS RR 2  MEWS LOC 0  MEWS Score 2  MEWS Score Color Yellow  Assess: if the MEWS score is Yellow or Red  Were vital signs taken at a resting state? Yes  Early Detection of Sepsis Score *See Row Information* Low  MEWS guidelines implemented *See Row Information* No, previously yellow, continue vital signs every 4 hours   Patient has been tolerating RA. SpO2 has decreased to 87% on RA at times, and there have been episodes of maintaining SpO2 88-89% on RA at times while asleep, but SpO2 has mostly maintained 90-93% on RA while patient resting in bed. No distress noted. Will continue to monitor.

## 2019-07-01 NOTE — Progress Notes (Signed)
PROGRESS NOTE                                                                                                                                                                                                             Patient Demographics:    Tracy Rosales, is a 44 y.o. female, DOB - [birth year], ONG:[id number]  Outpatient Primary MD for the patient is Patient, No Pcp Per    LOS - 3  Admit date - 06/27/2019    Chief Complaint  Patient presents with  . Shortness of Breath    COVID +       Brief Narrative - Tracy Rosales is a 44 y.o. female with medical history significant of asthma, bronchitis, depression, morbid obesity (BMI 46.35) presenting with complaints of shortness of breath.  She recently tested positive for COVID-19 on 4/16 during an urgent care visit, in the ER this admission she was found to have acute hypoxic respiratory failure due to COVID-19 pneumonia and admitted to the hospital.   Subjective:   Patient in bed appears to be in no distress, she is texting over the phone and watching television, denies any headache, chest pain is better.  Patient tells me right away that she would like to stay in the hospital for several more days until she is feeling 100%, she was told that this will not be possible as she is down to room air and all her work-up is essentially unremarkable now, she also tells me that she will need to be provided with paperwork saying that she cannot go back to work for several months, again she was told that this cannot be done beyond what is medically reasonable.  He then wanted to talk to case manager for insurance issues and she was told that this will be done as soon as possible.   Assessment  & Plan :     1. Acute Hypoxic Resp. Failure due to Acute Covid 19 Viral Pneumonitis during the ongoing 2020 Covid 19 Pandemic - she has severe disease with intense hypoxia requiring 6 to 8 L of oxygen and  increased work of breathing, was treated appropriately with combination of IV steroids, remdesivir and Actemra which she received on 06/28/2019.  Hypoxia has improved, continues to have cough for which she will be treated, we will continue to monitor closely her inflammatory  markers and clinically.  At rest she is on room air upon ambulation she is requiring 1 to 2 L nasal cannula oxygen and may be discharged on that on a as needed basis.  Encouraged the patient to sit up in chair in the daytime use I-S and flutter valve for pulmonary toiletry and then prone in bed when at night.  Will advance activity and titrate down oxygen as possible.    SpO2: 90 % O2 Flow Rate (L/min): 1 L/min  Recent Labs  Lab 06/27/19 2321 06/28/19 0339 06/29/19 0824 06/30/19 0407 07/01/19 0658  CRP 11.5*  --  2.8* 1.4* 0.8  DDIMER 0.59*  --  0.39 <0.27 0.29  BNP  --  22.4 56.8 27.6 25.2  PROCALCITON <0.10  --  <0.10 <0.10 <0.10    Hepatic Function Latest Ref Rng & Units 07/01/2019 06/30/2019 06/29/2019  Total Protein 6.5 - 8.1 g/dL 6.0(L) 6.5 6.4(L)  Albumin 3.5 - 5.0 g/dL 3.0(L) 3.0(L) 2.8(L)  AST 15 - 41 U/L _0 ALT 0 - 44 U/L 36 41 45(H)  Alk Phosphatase 38 - 126 U/L 38 43 43  Total Bilirubin 0.3 - 1.2 mg/dL 0.7 0.2(L) 0.4    2.  Obesity with BMI of 40.  Follow with PCP.  3.  Mild intermittent asthma.  Currently stable.  On scheduled and as needed albuterol inhalations.   4.  Few bibasilar crackles and mild orthopnea.  IV Lasix on 06/29/2019 and monitor.  Outpatient echogram.  5.  Left-sided pleuritic chest pain starting night of 06/29/2019.  EKG nonacute, high-sensitivity troponin negative, CT angiogram chest unremarkable and stable echocardiogram.  She likely has musculoskeletal pain due to cough which is better with NSAID cream.   Reluctance to go home and request for several months of leave of absence from work, see above in the subjective.    Condition - Extremely Guarded  Family  Communication  : Mother Velva Harman [phone] on 06/28/19 - 2.45pm   Code Status : Full  Diet :   Diet Order            Diet Heart Room service appropriate? Yes; Fluid consistency: Thin  Diet effective now               Disposition Plan  : Stay inpatient in the hospital for treatment of acute hypoxic respiratory failure caused by COVID-19 pneumonia.  Likely discharge home with home health PT and as needed oxygen on 07/02/2019.  Consults  :  None  Procedures  :  None  PUD Prophylaxis :   DVT Prophylaxis  :  Lovenox   Lab Results  Component Value Date   PLT 518 (H) 07/01/2019    Inpatient Medications  Scheduled Meds: . albuterol  2 puff Inhalation Q6H  . vitamin C  500 mg Oral Daily  . chlorpheniramine-HYDROcodone  5 mL Oral Q12H  . diclofenac Sodium  2 g Topical QID  . enoxaparin (LOVENOX) injection  50 mg Subcutaneous Daily  . mouth rinse  15 mL Mouth Rinse BID  . methylPREDNISolone (SOLU-MEDROL) injection  40 mg Intravenous Daily  . mometasone-formoterol  2 puff Inhalation BID  . zinc sulfate  220 mg Oral Daily   Continuous Infusions: . sodium chloride    . remdesivir 100 mg in NS 100 mL 100 mg (07/01/19 1049)   PRN Meds:.sodium chloride, acetaminophen, guaiFENesin-codeine, polyvinyl alcohol, zolpidem  Antibiotics  :    Anti-infectives (From admission, onward)   Start     Dose/Rate Route  Frequency Ordered Stop   06/29/19 1000  remdesivir 100 mg in sodium chloride 0.9 % 100 mL IVPB     100 mg 200 mL/hr over 30 Minutes Intravenous Daily 06/28/19 0128 07/03/19 0959   06/28/19 0330  remdesivir 100 mg in sodium chloride 0.9 % 100 mL IVPB     100 mg 200 mL/hr over 30 Minutes Intravenous Every 30 min 06/28/19 0321 06/28/19 0525   06/28/19 0130  remdesivir 100 mg in sodium chloride 0.9 % 100 mL IVPB     100 mg 200 mL/hr over 30 Minutes Intravenous Every 30 min 06/28/19 0128 06/28/19 0229       Time Spent in minutes  30   Lala Lund M.D on 07/01/2019 at 11:49  AM  To page go to [email or web address] - password [facility]  [facility] -  Office  [phone]  See all Orders from today for further details    Objective:   Vitals:   07/01/19 0320 07/01/19 0420 07/01/19 0425 07/01/19 0757  BP:   117/79 117/78  Pulse: 78 72 79 75  Resp: (!) 25 (!) 24 (!) 24 (!) 25  Temp:   98.3 F (36.8 C) 98.3 F (36.8 C)  TempSrc:   Oral Oral  SpO2: 90% 90% 92% 90%  Weight:      Height:        Wt Readings from Last 3 Encounters:  06/28/19 106.6 kg  06/20/19 122.5 kg  08/24/18 99.8 kg     Intake/Output Summary (Last 24 hours) at 07/01/2019 1149 Last data filed at 07/01/2019 0814 Gross per 24 hour  Intake 840 ml  Output 600 ml  Net 240 ml     Physical Exam  Awake Alert, No new F.N deficits, Normal affect Golden.AT,PERRAL Supple Neck,No JVD, No cervical lymphadenopathy appriciated.  Symmetrical Chest wall movement, Good air movement bilaterally, CTAB RRR,No Gallops, Rubs or new Murmurs, No Parasternal Heave +ve B.Sounds, Abd Soft, No tenderness, No organomegaly appriciated, No rebound - guarding or rigidity. No Cyanosis, Clubbing or edema, No new Rash or bruise    Data Review:    CBC Recent Labs  Lab 06/27/19 2321 06/29/19 0824 06/30/19 0407 07/01/19 0658  WBC 10.7* 10.7* 11.1* 12.6*  HGB 12.4 11.6* 12.1 12.4  HCT 39.6 37.6 39.2 39.6  PLT 338 479* 561* 518*  MCV 92.7 92.2 93.1 93.2  MCH 29.0 28.4 28.7 29.2  MCHC 31.3 30.9 30.9 31.3  RDW 14.1 13.7 13.7 13.5  LYMPHSABS 0.7 1.5 1.5 2.0  MONOABS 0.3 0.8 0.8 1.0  EOSABS 0.0 0.0 0.0 0.0  BASOSABS 0.0 0.0 0.0 0.0    Chemistries  Recent Labs  Lab 06/27/19 2321 06/28/19 0339 06/29/19 0824 06/30/19 0407 07/01/19 0658  NA 142  --  142 140 140  K 4.1  --  4.3 4.1 4.4  CL 106  --  104 102 104  CO2 26  --  _0 GLUCOSE 124*  --  138* 164* 137*  BUN 10  --  18 25* 21*  CREATININE 0.55  --  0.66 0.56 0.71  CALCIUM 8.7*  --  8.7* 8.8* 8.8*  AST 25  --  _1 ALT 32  --  45*  41 36  ALKPHOS 40  --  43 43 38  BILITOT 0.7  --  0.4 0.2* 0.7  MG  --  2.4 2.3 2.3 2.1     ------------------------------------------------------------------------------------------------------------------ No results for input(s): CHOL, HDL, LDLCALC, TRIG, CHOLHDL, LDLDIRECT in the last  72 hours.  Lab Results  Component Value Date   HGBA1C 5.6% 05/30/2017   ------------------------------------------------------------------------------------------------------------------ No results for input(s): TSH, T4TOTAL, T3FREE, THYROIDAB in the last 72 hours.  Invalid input(s): FREET3  Cardiac Enzymes No results for input(s): CKMB, TROPONINI, MYOGLOBIN in the last 168 hours.  Invalid input(s): CK ------------------------------------------------------------------------------------------------------------------    Component Value Date/Time   BNP 25.2 07/01/2019 7989    Micro Results Recent Results (from the past 240 hour(s))  Blood Culture (routine x 2)     Status: None (Preliminary result)   Collection Time: 06/27/19 11:21 PM   Specimen: BLOOD  Result Value Ref Range Status   Specimen Description BLOOD RIGHT ANTECUBITAL  Final   Special Requests   Final    BOTTLES DRAWN AEROBIC AND ANAEROBIC Blood Culture adequate volume   Culture   Final    NO GROWTH 3 DAYS Performed at [facility], [address] [place], [place] [address]    Report Status PENDING  Incomplete  Blood Culture (routine x 2)     Status: None (Preliminary result)   Collection Time: 06/28/19 12:00 AM   Specimen: BLOOD RIGHT ARM  Result Value Ref Range Status   Specimen Description BLOOD RIGHT ARM  Final   Special Requests   Final    BOTTLES DRAWN AEROBIC AND ANAEROBIC Blood Culture adequate volume   Culture   Final    NO GROWTH 3 DAYS Performed at [facility], [place] [address] [place], [place] [address]    Report Status PENDING  Incomplete  MRSA PCR Screening     Status: None   Collection Time:  06/29/19  3:47 AM   Specimen: Nasal Mucosa; Nasopharyngeal  Result Value Ref Range Status   MRSA by PCR NEGATIVE NEGATIVE Final    Comment:        The GeneXpert MRSA Assay (FDA approved for NASAL specimens only), is one component of a comprehensive MRSA colonization surveillance program. It is not intended to diagnose MRSA infection nor to guide or monitor treatment for MRSA infections. Performed at [facility], [place] [address], [place], [place] [address]     Radiology Reports CT ANGIO CHEST PE W OR WO CONTRAST  Result Date: 06/30/2019 CLINICAL DATA:  Shortness of.  COVID-19 positive EXAM: CT ANGIOGRAPHY CHEST WITH CONTRAST TECHNIQUE: Multidetector CT imaging of the chest was performed using the standard protocol during bolus administration of intravenous contrast. Multiplanar CT image reconstructions and MIPs were obtained to evaluate the vascular anatomy. CONTRAST:  75m OMNIPAQUE IOHEXOL 350 MG/ML SOLN COMPARISON:  June 27, 2019 FINDINGS: Cardiovascular: There is no demonstrable pulmonary embolus. There is no thoracic aortic aneurysm or dissection. Visualized great vessels appear normal. Right innominate and left common carotid arteries arise as a common trunk, an anatomic variant. Main pulmonary outflow tract measures 3.2 cm in diameter, prominent. Mediastinum/Nodes: Thyroid appears normal. No evident thoracic adenopathy. No esophageal lesions. Lungs/Pleura: There is ill-defined airspace opacity in each upper lobe, slightly more on the left than on the right. There is more extensive airspace opacity in each lower lobe, somewhat more on the left than on the right. No pleural effusions are evident. Upper Abdomen: Liver incompletely visualized but appears prominent. Visualized upper abdominal structures otherwise unremarkable. Musculoskeletal: No blastic or lytic bone lesions. No chest wall lesions evident. Review of the MIP images confirms the above findings. IMPRESSION: 1. No  demonstrable pulmonary embolus. No thoracic aortic aneurysm or dissection. 2. Multifocal pneumonia which may well be of atypical  organism etiology, particularly given the history. 3.  No evident adenopathy. 4.  Suspect hepatomegaly with incomplete visualization of liver. Electronically Signed   By: Lowella Grip III M.D.   On: 06/30/2019 14:06   DG Chest Port 1 View  Result Date: 06/27/2019 CLINICAL DATA:  Shortness of breath EXAM: PORTABLE CHEST 1 VIEW COMPARISON:  Radiograph 06/23/2019 FINDINGS: Lung volumes are chronically diminished with nonspecific elevation of the left hemidiaphragm which is similar to comparison studies. However, there is new areas of mixed interstitial and consolidative opacity predominantly within the lung bases and particular within the left lung periphery. No pneumothorax is evident. Obscuration of the left costophrenic sulcus could suggest trace pleural fluid. Prominence of the cardiomediastinal silhouette may be related to the low volumes and portable technique. No acute osseous or soft tissue abnormality. Telemetry leads overlie the chest. IMPRESSION: Findings are concerning for multifocal pneumonia markedly worsened from comparison exam 06/23/2019. Electronically Signed   By: Lovena Le M.D.   On: 06/27/2019 22:01   DG Chest Port 1 View  Result Date: 06/23/2019 CLINICAL DATA:  44 year old female with fever. EXAM: PORTABLE CHEST 1 VIEW COMPARISON:  Chest radiograph dated 08/24/2018. FINDINGS: There is mild eventration of the left hemidiaphragm with minimal left lung base atelectasis. No focal consolidation, pleural effusion, pneumothorax. The cardiac silhouette patch that stable cardiac silhouette. No acute osseous pathology. IMPRESSION: No active disease. Electronically Signed   By: Anner Crete M.D.   On: 06/23/2019 18:55   ECHOCARDIOGRAM COMPLETE  Result Date: 06/30/2019    ECHOCARDIOGRAM REPORT   Patient Name:   JENNAVIE Doucet Date of Exam: 06/30/2019 Medical Rec  #:  [id number]  Height:       64.0 in Accession #:    [id number] Weight:       235.0 lb Date of Birth:  10-29-1975 BSA:          2.095 m Patient Age:    19 years   BP:           130/105 mmHg Patient Gender: F          HR:           87 bpm. 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                 COVID-19 Positive.  Sonographer:    Jonelle Sidle Dance Referring Phys: Graylin Shiver [facility]  Sonographer Comments: Technically difficult study due to poor echo windows and suboptimal subcostal window. IMPRESSIONS  1. Left ventricular ejection fraction, by estimation, is 60 to 65%. The left ventricle has normal function. The left ventricle has no regional wall motion abnormalities. Left ventricular diastolic parameters were normal.  2. Right ventricular systolic function is normal. The right ventricular size is normal. Tricuspid regurgitation signal is inadequate for assessing PA pressure.  3. The mitral valve is normal in structure. No evidence of mitral valve regurgitation. No evidence of mitral stenosis.  4. The aortic valve is normal in structure. Aortic valve regurgitation is not visualized. No aortic stenosis is present.  5. The inferior vena cava is normal in size with greater than 50% respiratory variability, suggesting right atrial pressure of 3 mmHg. FINDINGS  Left Ventricle: Left ventricular ejection  fraction, by estimation, is 60 to 65%. The left ventricle has normal function. The left ventricle has no regional wall motion abnormalities. Definity contrast agent was given IV to delineate the left ventricular  endocardial borders. The left ventricular internal cavity size was normal in size. There is no left ventricular hypertrophy. Left ventricular diastolic parameters were normal.  Right Ventricle: The right ventricular size is normal. No increase in right ventricular wall thickness. Right ventricular systolic function is normal. Tricuspid regurgitation signal is inadequate for assessing PA pressure. Left Atrium: Left atrial size was normal in size. Right Atrium: Right atrial size was not well visualized. Pericardium: There is no evidence of pericardial effusion. Mitral Valve: The mitral valve is normal in structure. Normal mobility of the mitral valve leaflets. No evidence of mitral valve regurgitation. No evidence of mitral valve stenosis. Tricuspid Valve: The tricuspid valve is normal in structure. Tricuspid valve regurgitation is not demonstrated. No evidence of tricuspid stenosis. Aortic Valve: The aortic valve is normal in structure. Aortic valve regurgitation is not visualized. No aortic stenosis is present. Pulmonic Valve: The pulmonic valve was not well visualized. Pulmonic valve regurgitation is trivial. No evidence of pulmonic stenosis. Aorta: The aortic root is normal in size and structure. Venous: The inferior vena cava was not well visualized. The inferior vena cava is normal in size with greater than 50% respiratory variability, suggesting right atrial pressure of 3 mmHg. IAS/Shunts: The interatrial septum was not well visualized. Additional Comments: Normal left main coronary artery origin.  LEFT VENTRICLE PLAX 2D LVIDd:         4.10 cm  Diastology LVIDs:         2.43 cm  LV e' lateral:   6.53 cm/s LV PW:         1.02 cm  LV E/e' lateral: 11.2 LV IVS:        0.70 cm  LV e' medial:    6.85 cm/s LVOT diam:     2.10 cm  LV E/e' medial:  10.7 LV SV:         74 LV SV Index:   35 LVOT Area:     3.46 cm  RIGHT VENTRICLE             IVC RV Basal diam:  2.51 cm     IVC diam: 1.57 cm RV S prime:     17.00 cm/s TAPSE (M-mode): 2.2 cm LEFT ATRIUM             Index       RIGHT ATRIUM          Index LA diam:        4.20 cm 2.00 cm/m  RA Area:     9.78 cm LA Vol (A2C):   54.3 ml 25.92  ml/m RA Volume:   18.70 ml 8.93 ml/m LA Vol (A4C):   43.5 ml 20.76 ml/m LA Biplane Vol: 48.6 ml 23.20 ml/m  AORTIC VALVE LVOT Vmax:   103.00 cm/s LVOT Vmean:  60.300 cm/s LVOT VTI:    0.214 m  AORTA Ao Root diam: 3.20 cm Ao Asc diam:  2.90 cm MITRAL VALVE MV Area (PHT): 2.69 cm    SHUNTS MV Decel Time: 282 msec    Systemic VTI:  0.21 m MV E velocity: 73.20 cm/s  Systemic Diam: 2.10 cm MV A velocity: 67.90 cm/s MV E/A ratio:  1.08 Cherlynn Kaiser MD Electronically signed by Cherlynn Kaiser MD Signature Date/Time: 06/30/2019/4:11:37 PM    Final

## 2019-07-01 NOTE — TOC Benefit Eligibility Note (Signed)
Transition of Care [facility]) Benefit Eligibility Note    Patient Details  Name: Jakeisha Brickell MRN: [id number] Date of Birth: 1975-08-20                                Maryclare Labrador, RN Phone Number: 07/01/2019, 12:59 PM

## 2019-07-01 NOTE — TOC Initial Note (Signed)
Transition of Care [facility]) - Initial/Assessment Note    Patient Details  Name: Tracy Rosales MRN: [id number] Date of Birth: December 24, 1975  [facility] CM/SW Contact:    Maryclare Labrador, RN Phone Number: 07/01/2019, 12:57 PM  Clinical Narrative:    Per pt she was independent from home with her boyfriend. Pt informed CM that she was fired from her job in March with [employer]. Pt confirms she no longer has [employer] and she is currently unemployed. Pt is interested in DME and HH as ordered.  CM gave charity oxygen referral to [place] referral to [facility].  Both agencies will follow up with CM regarding acceptance/denial of referral               Expected Discharge Plan: [place]     Patient Goals and CMS Choice Patient states their goals for this hospitalization and ongoing recovery are:: Pt states she is ready to get back home with her boyfriend CMS Medicare.gov Compare Post Acute Care list provided to:: Patient Choice offered to / list presented to : Patient  Expected Discharge Plan and Services Expected Discharge Plan: [place] Choice: Home Health, Durable Medical Equipment Living arrangements for the past 2 months: Single Family Home                 DME Arranged: Oxygen DME Agency: [facility] Date DME Agency Contacted: 07/01/19 Time DME Agency Contacted: 1254 Representative spoke with at DME Agency: [place]            Prior Living Arrangements/Services Living arrangements for the past 2 months: [place] with:: Significant Other Patient language and need for interpreter reviewed:: No Do you feel safe going back to the place where you live?: Yes      Need for Family Participation in Patient Care: No (Comment) Care giver support system in place?: Yes (comment)   Criminal Activity/Legal Involvement Pertinent to Current Situation/Hospitalization: No - Comment as needed  Activities  of Daily Living Home Assistive Devices/Equipment: None ADL Screening (condition at time of admission) Patient's cognitive ability adequate to safely complete daily activities?: Yes Is the patient deaf or have difficulty hearing?: No Does the patient have difficulty seeing, even when wearing glasses/contacts?: No Does the patient have difficulty concentrating, remembering, or making decisions?: No Patient able to express need for assistance with ADLs?: Yes Does the patient have difficulty dressing or bathing?: No Independently performs ADLs?: Yes (appropriate for developmental age) Does the patient have difficulty walking or climbing stairs?: No Weakness of Legs: None Weakness of Arms/Hands: None  Permission Sought/Granted   Permission granted to share information with : Yes, Verbal Permission Granted              Emotional Assessment   Attitude/Demeanor/Rapport: Self-Confident, Engaged Affect (typically observed): Accepting Orientation: : Oriented to Self, Oriented to Place, Oriented to  Time, Oriented to Situation   Psych Involvement: No (comment)  Admission diagnosis:  Moderate persistent asthma with exacerbation [J45.41] Acute on chronic respiratory failure with hypoxia (HCC) [J96.21] Pneumonia due to COVID-19 virus [U07.1, J12.82] Patient Active Problem List   Diagnosis Date Noted  . Pneumonia due to COVID-19 virus 06/28/2019  . Acute respiratory failure with hypoxia ([place]) 06/28/2019  . Morbid obesity ([place]) 06/28/2019  . Obesity (BMI 30-39.9) 05/30/2017  . Family history of diabetes mellitus in mother 05/30/2017  . History of PID 05/30/2017  . Endometriosis 05/30/2017  .  Fibroids 05/30/2017  . Female dyspareunia 05/30/2017  . Mild intermittent asthma 05/30/2017  . BV (bacterial vaginosis) 05/30/2017  . Pelvic pain in female 10/13/2011   PCP:  Patient, No Pcp Per Pharmacy:   [facility] ([place] - [address] [place] [address] Phone: [phone] Fax: [phone]     Social Determinants of Health (SDOH) Interventions    Readmission Risk Interventions No flowsheet data found.

## 2019-07-02 MED ORDER — ALBUTEROL SULFATE HFA 108 (90 BASE) MCG/ACT IN AERS: 2.0000 | INHALATION_SPRAY | Freq: Four times a day (QID) | RESPIRATORY_TRACT | 0 refills | Status: AC | PRN

## 2019-07-02 MED ORDER — DICLOFENAC SODIUM 1 % EX GEL: 2.0000 g | Freq: Three times a day (TID) | CUTANEOUS | 0 refills | Status: DC | PRN

## 2019-07-02 NOTE — Discharge Summary (Signed)
Tracy Rosales QAS:[id number] DOB: 1975/04/15 DOA: 06/27/2019  PCP: Patient, No Pcp Per  Admit date: 06/27/2019  Discharge date: 07/02/2019  Admitted From: Home   Disposition:  Home   Recommendations for Outpatient Follow-up:   Follow up with PCP in 1-2 weeks  PCP Please obtain BMP/CBC, 2 view CXR in 1week,  (see Discharge instructions)   PCP Please follow up on the following pending results:    Home Health: PT   Equipment/Devices: 2 lit o2 PRN  Consultations: None  Discharge Condition: Stable    CODE STATUS: Full    Diet Recommendation: Heart Healthy   Diet Order            Diet - low sodium heart healthy        Diet Heart Room service appropriate? Yes; Fluid consistency: Thin  Diet effective now               Chief Complaint  Patient presents with  . Shortness of Breath    COVID +     Brief history of present illness from the day of admission and additional interim summary    Tracy Rosales a 44 y.o.femalewith medical history significant ofasthma, bronchitis, depression, morbid obesity (BMI 46.35) presenting with complaints of shortness of breath. She recently tested positive for COVID-19 on 4/16 during an urgent care visit, in the ER this admission she was found to have acute hypoxic respiratory failure due to COVID-19 pneumonia and admitted to the hospital.                                                                 Hospital Course    1. Acute Hypoxic Resp. Failure due to Acute Covid 19 Viral Pneumonitis during the ongoing 2020 Covid 19 Pandemic - she has severe disease with intense hypoxia requiring 6 to 8 L of oxygen and increased work of breathing, was treated appropriately with combination of IV steroids, remdesivir and Actemra which she received on 06/28/2019.  Clinically she has made  remarkable improvement and has been on room air at rest for the last 3 days, upon ambulation she does require 1 to 2 L of nasal cannula oxygen at times however overall completely asymptomatic at rest and appears to be in no distress whatsoever, she has finished her Covid treatment completely, will be discharged home on 2 L nasal cannula oxygen as needed upon ambulation along with a rescue inhaler and follow-up with PCP.     Recent Labs  Lab 06/27/19 2321 06/28/19 0339 06/29/19 0824 06/30/19 0407 07/01/19 0658  CRP 11.5*  --  2.8* 1.4* 0.8  DDIMER 0.59*  --  0.39 <0.27 0.29  FERRITIN 103  --   --   --   --   BNP  --  22.4 56.8 27.6 25.2  PROCALCITON <  0.10  --  <0.10 <0.10 <0.10    Hepatic Function Latest Ref Rng & Units 07/01/2019 06/30/2019 06/29/2019  Total Protein 6.5 - 8.1 g/dL 6.0(L) 6.5 6.4(L)  Albumin 3.5 - 5.0 g/dL 3.0(L) 3.0(L) 2.8(L)  AST 15 - 41 U/L _0 ALT 0 - 44 U/L 36 41 45(H)  Alk Phosphatase 38 - 126 U/L 38 43 43  Total Bilirubin 0.3 - 1.2 mg/dL 0.7 0.2(L) 0.4    2.  Obesity with BMI of 40.  Follow with PCP.  3.  Mild intermittent asthma.  Currently stable.  On scheduled and as needed albuterol inhalations.   4.  Few bibasilar crackles and mild orthopnea.  IV Lasix on 06/29/2019 and monitor.  Outpatient echogram.  5.  Left-sided pleuritic chest pain starting night of 06/29/2019.  EKG nonacute, high-sensitivity troponin negative, CT angiogram chest unremarkable and stable echocardiogram.  She likely has musculoskeletal pain due to cough which is better with NSAID cream.   Reluctance to go home and request for several months of leave of absence from work, note from yesterday below.  Patient tells me right away that she would like to stay in the hospital for several more days until she is feeling 100%, she was told that this will not be possible as she is down to room air and all her work-up is essentially unremarkable now, she also tells me that she will need  to be provided with paperwork saying that she cannot go back to work for several months, again she was told that this cannot be done beyond what is medically reasonable.  He then wanted to talk to case manager for insurance issues and she was told that this will be done as soon as possible.   Discharge diagnosis     Principal Problem:   Pneumonia due to COVID-19 virus Active Problems:   Mild intermittent asthma   Acute respiratory failure with hypoxia (HCC)   Morbid obesity (HCC)    Discharge instructions    Discharge Instructions    Diet - low sodium heart healthy   Complete by: As directed    Discharge instructions   Complete by: As directed    Follow with Primary MD Patient, No Pcp Per in 7 days   Get CBC, CMP, 2 view Chest X ray -  checked next visit within 1 week by Primary MD   Activity: As tolerated with Full fall precautions use walker/cane & assistance as needed  Disposition Home    Diet: Heart Healthy     Special Instructions: If you have smoked or chewed Tobacco  in the last 2 yrs please stop smoking, stop any regular Alcohol  and or any Recreational drug use.  On your next visit with your primary care physician please Get Medicines reviewed and adjusted.  Please request your Prim.MD to go over all Hospital Tests and Procedure/Radiological results at the follow up, please get all Hospital records sent to your Prim MD by signing hospital release before you go home.  If you experience worsening of your admission symptoms, develop shortness of breath, life threatening emergency, suicidal or homicidal thoughts you must seek medical attention immediately by calling 911 or calling your MD immediately  if symptoms less severe.  You Must read complete instructions/literature along with all the possible adverse reactions/side effects for all the Medicines you take and that have been prescribed to you. Take any new Medicines after you have completely understood and accpet all  the possible adverse reactions/side  effects.   Increase activity slowly   Complete by: As directed    MyChart COVID-19 home monitoring program   Complete by: Jul 02, 2019    Is the patient willing to use the [place] for home monitoring?: Yes   Temperature monitoring   Complete by: Jul 02, 2019    After how many days would you like to receive a notification of this patient's flowsheet entries?: 1      Discharge Medications   Allergies as of 07/02/2019      Reactions   Codeine Itching      Medication List    STOP taking these medications   brompheniramine-pseudoephedrine-DM 30-2-10 MG/5ML syrup   cyclobenzaprine 10 MG tablet Commonly known as: FLEXERIL   diclofenac 75 MG EC tablet Commonly known as: VOLTAREN   ibuprofen 200 MG tablet Commonly known as: ADVIL   methylPREDNISolone 4 MG Tbpk tablet Commonly known as: MEDROL DOSEPAK   predniSONE 50 MG tablet Commonly known as: DELTASONE     TAKE these medications   albuterol 108 (90 Base) MCG/ACT inhaler Commonly known as: VENTOLIN HFA Inhale 2 puffs into the lungs every 6 (six) hours as needed for wheezing or shortness of breath.   diclofenac Sodium 1 % Gel Commonly known as: VOLTAREN Apply 2 g topically 3 (three) times daily as needed (Apply on the left chest wall as needed for pain). Marland Kitchen   trimethoprim-polymyxin b ophthalmic solution Commonly known as: Polytrim Place 2 drops into both eyes in the morning, at noon, and at bedtime.            Durable Medical Equipment  (From admission, onward)         Start     Ordered   07/01/19 1153  For home use only DME oxygen  Once    Question Answer Comment  Length of Need 6 Months   Mode or (Route) Nasal cannula   Liters per Minute 2   Frequency Continuous (stationary and portable oxygen unit needed)   Oxygen delivery system Gas      07/01/19 1152          Follow-up Information    Your PCP. Schedule an appointment as soon as possible for a visit  in 1 week(s).           Major procedures and Radiology Reports - PLEASE review detailed and final reports thoroughly  -        CT ANGIO CHEST PE W OR WO CONTRAST  Result Date: 06/30/2019 CLINICAL DATA:  Shortness of.  COVID-19 positive EXAM: CT ANGIOGRAPHY CHEST WITH CONTRAST TECHNIQUE: Multidetector CT imaging of the chest was performed using the standard protocol during bolus administration of intravenous contrast. Multiplanar CT image reconstructions and MIPs were obtained to evaluate the vascular anatomy. CONTRAST:  62m OMNIPAQUE IOHEXOL 350 MG/ML SOLN COMPARISON:  June 27, 2019 FINDINGS: Cardiovascular: There is no demonstrable pulmonary embolus. There is no thoracic aortic aneurysm or dissection. Visualized great vessels appear normal. Right innominate and left common carotid arteries arise as a common trunk, an anatomic variant. Main pulmonary outflow tract measures 3.2 cm in diameter, prominent. Mediastinum/Nodes: Thyroid appears normal. No evident thoracic adenopathy. No esophageal lesions. Lungs/Pleura: There is ill-defined airspace opacity in each upper lobe, slightly more on the left than on the right. There is more extensive airspace opacity in each lower lobe, somewhat more on the left than on the right. No pleural effusions are evident. Upper Abdomen: Liver incompletely visualized but appears prominent. Visualized  upper abdominal structures otherwise unremarkable. Musculoskeletal: No blastic or lytic bone lesions. No chest wall lesions evident. Review of the MIP images confirms the above findings. IMPRESSION: 1. No demonstrable pulmonary embolus. No thoracic aortic aneurysm or dissection. 2. Multifocal pneumonia which may well be of atypical organism etiology, particularly given the history. 3.  No evident adenopathy. 4.  Suspect hepatomegaly with incomplete visualization of liver. Electronically Signed   By: Lowella Grip III M.D.   On: 06/30/2019 14:06   DG Chest Port 1  View  Result Date: 06/27/2019 CLINICAL DATA:  Shortness of breath EXAM: PORTABLE CHEST 1 VIEW COMPARISON:  Radiograph 06/23/2019 FINDINGS: Lung volumes are chronically diminished with nonspecific elevation of the left hemidiaphragm which is similar to comparison studies. However, there is new areas of mixed interstitial and consolidative opacity predominantly within the lung bases and particular within the left lung periphery. No pneumothorax is evident. Obscuration of the left costophrenic sulcus could suggest trace pleural fluid. Prominence of the cardiomediastinal silhouette may be related to the low volumes and portable technique. No acute osseous or soft tissue abnormality. Telemetry leads overlie the chest. IMPRESSION: Findings are concerning for multifocal pneumonia markedly worsened from comparison exam 06/23/2019. Electronically Signed   By: Lovena Le M.D.   On: 06/27/2019 22:01   DG Chest Port 1 View  Result Date: 06/23/2019 CLINICAL DATA:  44 year old female with fever. EXAM: PORTABLE CHEST 1 VIEW COMPARISON:  Chest radiograph dated 08/24/2018. FINDINGS: There is mild eventration of the left hemidiaphragm with minimal left lung base atelectasis. No focal consolidation, pleural effusion, pneumothorax. The cardiac silhouette patch that stable cardiac silhouette. No acute osseous pathology. IMPRESSION: No active disease. Electronically Signed   By: Anner Crete M.D.   On: 06/23/2019 18:55   ECHOCARDIOGRAM COMPLETE  Result Date: 06/30/2019    ECHOCARDIOGRAM REPORT   Patient Name:   Tracy Rosales Date of Exam: 06/30/2019 Medical Rec #:  [id number]  Height:       64.0 in Accession #:    [id number] Weight:       235.0 lb Date of Birth:  03/11/1975 BSA:          2.095 m Patient Age:    44 years   BP:           130/105 mmHg Patient Gender: F          HR:           87 bpm. 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                 COVID-19 Positive.  Sonographer:    Jonelle Sidle Dance Referring Phys: Graylin Shiver [facility]  Sonographer Comments: Technically difficult study due to poor echo windows and suboptimal subcostal window. IMPRESSIONS  1. Left ventricular ejection fraction, by estimation, is 60 to 65%. The left ventricle has normal function. The left ventricle has no regional wall motion abnormalities. Left ventricular diastolic parameters were normal.  2. Right ventricular systolic function is normal. The right ventricular size is normal. Tricuspid regurgitation signal is inadequate for assessing PA pressure.  3. The mitral valve is normal in structure. No evidence of mitral valve regurgitation. No evidence of mitral stenosis.  4.  The aortic valve is normal in structure. Aortic valve regurgitation is not visualized. No aortic stenosis is present.  5. The inferior vena cava is normal in size with greater than 50% respiratory variability, suggesting right atrial pressure of 3 mmHg. FINDINGS  Left Ventricle: Left ventricular ejection fraction, by estimation, is 60 to 65%. The left ventricle has normal function. The left ventricle has no regional wall motion abnormalities. Definity contrast agent was given IV to delineate the left ventricular  endocardial borders. The left ventricular internal cavity size was normal in size. There is no left ventricular hypertrophy. Left ventricular diastolic parameters were normal. Right Ventricle: The right ventricular size is normal. No increase in right ventricular wall thickness. Right ventricular systolic function is normal. Tricuspid regurgitation signal is inadequate for assessing PA pressure. Left Atrium: Left atrial size was normal in size. Right Atrium: Right atrial size was not well visualized. Pericardium: There is no evidence of  pericardial effusion. Mitral Valve: The mitral valve is normal in structure. Normal mobility of the mitral valve leaflets. No evidence of mitral valve regurgitation. No evidence of mitral valve stenosis. Tricuspid Valve: The tricuspid valve is normal in structure. Tricuspid valve regurgitation is not demonstrated. No evidence of tricuspid stenosis. Aortic Valve: The aortic valve is normal in structure. Aortic valve regurgitation is not visualized. No aortic stenosis is present. Pulmonic Valve: The pulmonic valve was not well visualized. Pulmonic valve regurgitation is trivial. No evidence of pulmonic stenosis. Aorta: The aortic root is normal in size and structure. Venous: The inferior vena cava was not well visualized. The inferior vena cava is normal in size with greater than 50% respiratory variability, suggesting right atrial pressure of 3 mmHg. IAS/Shunts: The interatrial septum was not well visualized. Additional Comments: Normal left main coronary artery origin.  LEFT VENTRICLE PLAX 2D LVIDd:         4.10 cm  Diastology LVIDs:         2.43 cm  LV e' lateral:   6.53 cm/s LV PW:         1.02 cm  LV E/e' lateral: 11.2 LV IVS:        0.70 cm  LV e' medial:    6.85 cm/s LVOT diam:     2.10 cm  LV E/e' medial:  10.7 LV SV:         74 LV SV Index:   35 LVOT Area:     3.46 cm  RIGHT VENTRICLE             IVC RV Basal diam:  2.51 cm     IVC diam: 1.57 cm RV S prime:     17.00 cm/s TAPSE (M-mode): 2.2 cm LEFT ATRIUM             Index       RIGHT ATRIUM          Index LA diam:        4.20 cm 2.00 cm/m  RA Area:     9.78 cm LA Vol (A2C):   54.3 ml 25.92 ml/m RA Volume:   18.70 ml 8.93 ml/m LA Vol (A4C):   43.5 ml 20.76 ml/m LA Biplane Vol: 48.6 ml 23.20 ml/m  AORTIC VALVE LVOT Vmax:   103.00 cm/s LVOT Vmean:  60.300 cm/s LVOT VTI:    0.214 m  AORTA Ao Root diam: 3.20 cm Ao Asc diam:  2.90 cm MITRAL VALVE MV Area (PHT): 2.69 cm    SHUNTS MV Decel Time: 282 msec  Systemic VTI:  0.21 m MV E velocity: 73.20 cm/s   Systemic Diam: 2.10 cm MV A velocity: 67.90 cm/s MV E/A ratio:  1.08 Cherlynn Kaiser MD Electronically signed by Cherlynn Kaiser MD Signature Date/Time: 06/30/2019/4:11:37 PM    Final     Micro Results     Recent Results (from the past 240 hour(s))  Blood Culture (routine x 2)     Status: None (Preliminary result)   Collection Time: 06/27/19 11:21 PM   Specimen: BLOOD  Result Value Ref Range Status   Specimen Description BLOOD RIGHT ANTECUBITAL  Final   Special Requests   Final    BOTTLES DRAWN AEROBIC AND ANAEROBIC Blood Culture adequate volume   Culture   Final    NO GROWTH 3 DAYS Performed at [facility], [address] [place], [place] [address]    Report Status PENDING  Incomplete  Blood Culture (routine x 2)     Status: None (Preliminary result)   Collection Time: 06/28/19 12:00 AM   Specimen: BLOOD RIGHT ARM  Result Value Ref Range Status   Specimen Description BLOOD RIGHT ARM  Final   Special Requests   Final    BOTTLES DRAWN AEROBIC AND ANAEROBIC Blood Culture adequate volume   Culture   Final    NO GROWTH 3 DAYS Performed at [facility], [place] [address], [place], [place] [address]    Report Status PENDING  Incomplete  MRSA PCR Screening     Status: None   Collection Time: 06/29/19  3:47 AM   Specimen: Nasal Mucosa; Nasopharyngeal  Result Value Ref Range Status   MRSA by PCR NEGATIVE NEGATIVE Final    Comment:        The GeneXpert MRSA Assay (FDA approved for NASAL specimens only), is one component of a comprehensive MRSA colonization surveillance program. It is not intended to diagnose MRSA infection nor to guide or monitor treatment for MRSA infections. Performed at [facility], [place] [address], [place], [place] [address]     Today   Subjective    Tracy Rosales today has no headache,no chest abdominal pain,no new weakness tingling or numbness, feels much better     Objective   Blood pressure 106/66, pulse 72, temperature 99 F  (37.2 C), resp. rate 20, height 5' 4" (1.626 m), weight 109 kg, SpO2 90 %.   Intake/Output Summary (Last 24 hours) at 07/02/2019 0843 Last data filed at 07/01/2019 2300 Gross per 24 hour  Intake 720 ml  Output 400 ml  Net 320 ml    Exam Awake Alert,  No new F.N deficits, Normal affect [place].AT,PERRAL Supple Neck,No JVD, No cervical lymphadenopathy appriciated.  Symmetrical Chest wall movement, Good air movement bilaterally, CTAB RRR,No Gallops,Rubs or new Murmurs, No Parasternal Heave +ve B.Sounds, Abd Soft, Non tender, No organomegaly appriciated, No rebound -guarding or rigidity. No Cyanosis, Clubbing or edema, No new Rash or bruise   Data Review   CBC w Diff:  Lab Results  Component Value Date   WBC 12.6 (H) 07/01/2019   HGB 12.4 07/01/2019   HGB 10.3 (L) 05/30/2017   HCT 39.6 07/01/2019   HCT 33.9 (L) 05/30/2017   PLT 518 (H) 07/01/2019   PLT 472 (H) 05/30/2017   LYMPHOPCT 16 07/01/2019   MONOPCT 8 07/01/2019   EOSPCT 0 07/01/2019   BASOPCT 0 07/01/2019    CMP:  Lab Results  Component Value Date   NA 140 07/01/2019   NA 141 05/30/2017  K 4.4 07/01/2019   CL 104 07/01/2019   CO2 29 07/01/2019   BUN 21 (H) 07/01/2019   BUN 13 05/30/2017   CREATININE 0.71 07/01/2019   PROT 6.0 (L) 07/01/2019   PROT 6.8 05/30/2017   ALBUMIN 3.0 (L) 07/01/2019   ALBUMIN 4.4 05/30/2017   BILITOT 0.7 07/01/2019   BILITOT 0.3 05/30/2017   ALKPHOS 38 07/01/2019   AST 17 07/01/2019   ALT 36 07/01/2019  .   Total Time in preparing paper work, data evaluation and todays exam - 38 minutes  Lala Lund M.D on 07/02/2019 at 8:43 AM  [facility]   Office  [phone]

## 2019-07-02 NOTE — TOC Transition Note (Addendum)
Transition of Care [facility]) - CM/SW Discharge Note   Patient Details  Name: Tracy Rosales MRN: [id number] Date of Birth: Oct 13, 1975  Transition of Care [facility]) CM/SW Contact:  Carles Collet, RN Phone Number: 07/02/2019, 10:28 AM   Clinical Narrative:    Damaris Schooner w patient over the phone. She states that she has three albuterol inhalers at home, will not need MATCH. She states that [employer] has dropped off her oxygen for home use to her room. Patient declined [facility] PT Follow up has been made at [facility], patient aware of appointment time and date, added to AVS. Patient will need transport home, and will need to use [facility] since she is COVID +. She is awaiting to hear back from her BF or son that they are home and the house is open, she does not have a key. Messaged bedside nurse above information.   13:50 Spoke w patient and she states that her son is home. Gave permission to call PT[facility]. [facility] called and notified that she wil need to take O2 tanks with her. Nurse notified that Corey Harold has been called.      Final next level of care: [place] Barriers to Discharge: No Barriers Identified   Patient Goals and CMS Choice Patient states their goals for this hospitalization and ongoing recovery are:: Pt states she is ready to get back home with her boyfriend CMS Medicare.gov Compare Post Acute Care list provided to:: Patient Choice offered to / list presented to : Patient  Discharge Placement                       Discharge Plan and Services     Post Acute Care Choice: Home Health, Durable Medical Equipment          DME Arranged: Oxygen DME Agency: [employer] Date DME Agency Contacted: 07/01/19 Time DME Agency Contacted: [id number] Representative spoke with at DME Agency: [place]: PT [place]: [place] Date [place]: 07/02/19 Time [place]: 1027 Representative spoke with at [place]: [place] ([place]) Interventions     Readmission Risk Interventions No flowsheet data found.

## 2019-07-02 NOTE — Progress Notes (Signed)
PT Cancellation Note  Patient Details Name: Tracy Rosales MRN: [id number] DOB: [birth year]   Cancelled Treatment:    Reason Eval/Treat Not Completed: Patient declined, no reason specified  Patient declined physical therapy session stating she just wanted to lay in bed. Patient declined having any concerns about her mobility for discharge home. Discharge home planned for today.  Birdie Hopes 07/02/2019, 9:16 AM

## 2019-07-02 NOTE — Discharge Instructions (Signed)
Follow with Primary MD Patient, No Pcp Per in 7 days   Get CBC, CMP, 2 view Chest X ray -  checked next visit within 1 week by Primary MD   Activity: As tolerated with Full fall precautions use walker/cane & assistance as needed  Disposition Home    Diet: Heart Healthy     Special Instructions: If you have smoked or chewed Tobacco  in the last 2 yrs please stop smoking, stop any regular Alcohol  and or any Recreational drug use.  On your next visit with your primary care physician please Get Medicines reviewed and adjusted.  Please request your Prim.MD to go over all Hospital Tests and Procedure/Radiological results at the follow up, please get all Hospital records sent to your Prim MD by signing hospital release before you go home.  If you experience worsening of your admission symptoms, develop shortness of breath, life threatening emergency, suicidal or homicidal thoughts you must seek medical attention immediately by calling 911 or calling your MD immediately  if symptoms less severe.  You Must read complete instructions/literature along with all the possible adverse reactions/side effects for all the Medicines you take and that have been prescribed to you. Take any new Medicines after you have completely understood and accpet all the possible adverse reactions/side effects.       Person Under Monitoring Name: Tracy Rosales  Location: [place] [address]   Infection Prevention Recommendations for Individuals Confirmed to have, or Being Evaluated for, 2019 Novel Coronavirus (COVID-19) Infection Who Receive Care at Home  Individuals who are confirmed to have, or are being evaluated for, COVID-19 should follow the prevention steps below until a healthcare provider or local or state health department says they can return to normal activities.  Stay home except to get medical care You should restrict activities outside your home, except for getting medical care. Do  not go to work, school, or public areas, and do not use public transportation or taxis.  Call ahead before visiting your doctor Before your medical appointment, call the healthcare provider and tell them that you have, or are being evaluated for, COVID-19 infection. This will help the healthcare provider's office take steps to keep other people from getting infected. Ask your healthcare provider to call the local or state health department.  Monitor your symptoms Seek prompt medical attention if your illness is worsening (e.g., difficulty breathing). Before going to your medical appointment, call the healthcare provider and tell them that you have, or are being evaluated for, COVID-19 infection. Ask your healthcare provider to call the local or state health department.  Wear a facemask You should wear a facemask that covers your nose and mouth when you are in the same room with other people and when you visit a healthcare provider. People who live with or visit you should also wear a facemask while they are in the same room with you.  Separate yourself from other people in your home As much as possible, you should stay in a different room from other people in your home. Also, you should use a separate bathroom, if available.  Avoid sharing household items You should not share dishes, drinking glasses, cups, eating utensils, towels, bedding, or other items with other people in your home. After using these items, you should wash them thoroughly with soap and water.  Cover your coughs and sneezes Cover your mouth and nose with a tissue when you cough or sneeze, or you can cough or  sneeze into your sleeve. Throw used tissues in a lined trash can, and immediately wash your hands with soap and water for at least 20 seconds or use an alcohol-based hand rub.  Wash your [facility] your hands often and thoroughly with soap and water for at least 20 seconds. You can use an alcohol-based  hand sanitizer if soap and water are not available and if your hands are not visibly dirty. Avoid touching your eyes, nose, and mouth with unwashed hands.   Prevention Steps for Caregivers and Household Members of Individuals Confirmed to have, or Being Evaluated for, COVID-19 Infection Being Cared for in the Home  If you live with, or provide care at home for, a person confirmed to have, or being evaluated for, COVID-19 infection please follow these guidelines to prevent infection:  Follow healthcare provider's instructions Make sure that you understand and can help the patient follow any healthcare provider instructions for all care.  Provide for the patient's basic needs You should help the patient with basic needs in the home and provide support for getting groceries, prescriptions, and other personal needs.  Monitor the patient's symptoms If they are getting sicker, call his or her medical provider and tell them that the patient has, or is being evaluated for, COVID-19 infection. This will help the healthcare provider's office take steps to keep other people from getting infected. Ask the healthcare provider to call the local or state health department.  Limit the number of people who have contact with the patient  If possible, have only one caregiver for the patient.  Other household members should stay in another home or place of residence. If this is not possible, they should stay  in another room, or be separated from the patient as much as possible. Use a separate bathroom, if available.  Restrict visitors who do not have an essential need to be in the home.  Keep older adults, very young children, and other sick people away from the patient Keep older adults, very young children, and those who have compromised immune systems or chronic health conditions away from the patient. This includes people with chronic heart, lung, or kidney conditions, diabetes, and  cancer.  Ensure good ventilation Make sure that shared spaces in the home have good air flow, such as from an air conditioner or an opened window, weather permitting.  Wash your hands often  Wash your hands often and thoroughly with soap and water for at least 20 seconds. You can use an alcohol based hand sanitizer if soap and water are not available and if your hands are not visibly dirty.  Avoid touching your eyes, nose, and mouth with unwashed hands.  Use disposable paper towels to dry your hands. If not available, use dedicated cloth towels and replace them when they become wet.  Wear a facemask and gloves  Wear a disposable facemask at all times in the room and gloves when you touch or have contact with the patient's blood, body fluids, and/or secretions or excretions, such as sweat, saliva, sputum, nasal mucus, vomit, urine, or feces.  Ensure the mask fits over your nose and mouth tightly, and do not touch it during use.  Throw out disposable facemasks and gloves after using them. Do not reuse.  Wash your hands immediately after removing your facemask and gloves.  If your personal clothing becomes contaminated, carefully remove clothing and launder. Wash your hands after handling contaminated clothing.  Place all used disposable facemasks, gloves, and other waste  in a lined container before disposing them with other household waste.  Remove gloves and wash your hands immediately after handling these items.  Do not share dishes, glasses, or other household items with the patient  Avoid sharing household items. You should not share dishes, drinking glasses, cups, eating utensils, towels, bedding, or other items with a patient who is confirmed to have, or being evaluated for, COVID-19 infection.  After the person uses these items, you should wash them thoroughly with soap and water.  Wash laundry thoroughly  Immediately remove and wash clothes or bedding that have blood, body  fluids, and/or secretions or excretions, such as sweat, saliva, sputum, nasal mucus, vomit, urine, or feces, on them.  Wear gloves when handling laundry from the patient.  Read and follow directions on labels of laundry or clothing items and detergent. In general, wash and dry with the warmest temperatures recommended on the label.  Clean all areas the individual has used often  Clean all touchable surfaces, such as counters, tabletops, doorknobs, bathroom fixtures, toilets, phones, keyboards, tablets, and bedside tables, every day. Also, clean any surfaces that may have blood, body fluids, and/or secretions or excretions on them.  Wear gloves when cleaning surfaces the patient has come in contact with.  Use a diluted bleach solution (e.g., dilute bleach with 1 part bleach and 10 parts water) or a household disinfectant with a label that says EPA-registered for coronaviruses. To make a bleach solution at home, add 1 tablespoon of bleach to 1 quart (4 cups) of water. For a larger supply, add  cup of bleach to 1 gallon (16 cups) of water.  Read labels of cleaning products and follow recommendations provided on product labels. Labels contain instructions for safe and effective use of the cleaning product including precautions you should take when applying the product, such as wearing gloves or eye protection and making sure you have good ventilation during use of the product.  Remove gloves and wash hands immediately after cleaning.  Monitor yourself for signs and symptoms of illness Caregivers and household members are considered close contacts, should monitor their health, and will be asked to limit movement outside of the home to the extent possible. Follow the monitoring steps for close contacts listed on the symptom monitoring form.   ? If you have additional questions, contact your local health department or call the epidemiologist on call at [phone] (available 24/7). ? This  guidance is subject to change. For the most up-to-date guidance from [facility], please refer to their website: [email or web address]

## 2019-07-03 ENCOUNTER — Encounter (INDEPENDENT_AMBULATORY_CARE_PROVIDER_SITE_OTHER): Payer: Self-pay

## 2019-07-03 LAB — CULTURE, BLOOD (ROUTINE X 2)
Culture: NO GROWTH
Culture: NO GROWTH
Special Requests: ADEQUATE
Special Requests: ADEQUATE

## 2019-07-04 ENCOUNTER — Telehealth: Payer: Self-pay

## 2019-07-04 ENCOUNTER — Encounter (INDEPENDENT_AMBULATORY_CARE_PROVIDER_SITE_OTHER): Payer: Self-pay

## 2019-07-04 NOTE — Telephone Encounter (Signed)
Attempted to contact Tracy Rosales to advise of information below and to see what symptoms she was having. Patient didn't answer and the vm was full. Called patent twice.   IF PATIENT HAS WORSENING WEAKNESS WITH INABILITY TO STAND OR IF PATIENT HAS TO HOLD ON TO SOMETHING TO GET BALANCE, ADVISE PATIENT TO CALL 911 AND SEEK TREATMENT IN ED   If diarrhea remains the same: encourage patient to drink oral fluids and bland foods.   Avoid alcohol, spicy foods, caffeine or fatty foods that could make diarrhea worse.   Continue to monitor for signs of dehydration (increased thirst decreased urine output, yellow urine, dry skin, headache or dizziness).   Advise patient to try OTC medication (Imodium, kaopectate, Pepto-Bismol) as per manufacturer's instructions.     If worsening diarrhea occurs and becomes severe (6-7 bowel movements a day): notify PCP   If diarrhea last greater than 7 days: notify PCP   IF SIGNS OF DEHYDRATION OCCUR (INCREASED THIRST, DECREASED URINE OUTPUT, YELLOW URINE, DRY SKIN, HEADACHE OR DIZZINESS) ADVISE PATIENT TO CALL 911 AND SEEK TREATMENT IN THE ED

## 2019-07-06 ENCOUNTER — Encounter (INDEPENDENT_AMBULATORY_CARE_PROVIDER_SITE_OTHER): Payer: Self-pay

## 2019-07-08 ENCOUNTER — Encounter (INDEPENDENT_AMBULATORY_CARE_PROVIDER_SITE_OTHER): Payer: Self-pay

## 2019-07-09 ENCOUNTER — Encounter (INDEPENDENT_AMBULATORY_CARE_PROVIDER_SITE_OTHER): Payer: Self-pay

## 2019-07-09 ENCOUNTER — Other Ambulatory Visit: Payer: Self-pay

## 2019-07-09 ENCOUNTER — Ambulatory Visit (INDEPENDENT_AMBULATORY_CARE_PROVIDER_SITE_OTHER): Payer: Self-pay | Admitting: Nurse Practitioner

## 2019-07-09 VITALS — BP 102/72 | HR 97 | Temp 97.3°F | Wt 237.5 lb

## 2019-07-09 DIAGNOSIS — J9601 Acute respiratory failure with hypoxia: Secondary | ICD-10-CM

## 2019-07-09 DIAGNOSIS — Z8616 Personal history of COVID-19: Secondary | ICD-10-CM

## 2019-07-09 DIAGNOSIS — R05 Cough: Secondary | ICD-10-CM

## 2019-07-09 DIAGNOSIS — R059 Cough, unspecified: Secondary | ICD-10-CM

## 2019-07-09 MED ORDER — MONTELUKAST SODIUM 10 MG PO TABS: 10.0000 mg | ORAL_TABLET | Freq: Every day | ORAL | 3 refills | Status: AC

## 2019-07-09 NOTE — Patient Instructions (Addendum)
Covid Pneumonia History of asthma Shortness of breath:  Stable - slowly improving  May continue O2 at 2 L with exertion and RA at rest  Patient walked in office today - O2 sats remained above 91% for entire walk on RA - patient did become fatigued quickly  Continue albuterol as needed  Please start Symbicort 2 puffs twice daily - patient has this at home  Will order Singulair  Will repeat lab work and chest x ray in 2 weeks  Continue incentive spirometer and flutter valve   Cough:  - Start Prilosec daily -Use hard candies (sugarless Jolly Ranchers) or non-mint or non-menthol containing cough drops  - Use a cough suppressant (Delsym) around the clock for the next few days  Fatigue:  Increase activity as tolerated   Follow up:  2 weeks

## 2019-07-09 NOTE — Progress Notes (Signed)
@Patient  ID: Tracy Rosales, female    DOB: 09-Sep-1975, 44 y.o.   MRN: [id number]  Chief Complaint  Patient presents with  . Hospitalization Follow-up    Tested positive for COVID 4/19. Was admitted 4/23-4/28. Patient still having sx: headaches, cough, headaches, insomnia and started havind diarrhea. Patient stated she does use 2L of O2 with exertion.     Referring provider: No ref. provider found   44 year old female with history of asthma, bronchitis, depression, morbid obesity.  Diagnosed with Covid on 06/20/2019.  06/27/19 - 07/02/19 Hospital admission: Patient was admitted to the hospital with acute hypoxic respiratory failure due to Covid pneumonia.  Patient was treated with IV steroids, remdesivir, and Actemra.  Patient was discharged home with 2 L of O2 to use with exertion and room air at rest.  HPI  Patient presents today for a post Covid care clinic visit/hospital follow-up.  Patient has been stable since hospital discharge.  She states that she is still having shortness of breath.  She states that she cannot tolerate going to a grocery store due to shortness of breath with exertion.  She does still have a nonproductive cough.  Patient does uses albuterol as needed.  She does complain of some burning in her stomach.  Patient comes the office today without oxygen.  She was advised to use 2 L of oxygen with exertion at hospital discharge.  She has been noncompliant with this.  Patient is concerned today because she is no longer working and does not have insurance.  We will give her some information on financial assistance. Denies f/c/s, n/v/d, hemoptysis, PND, chest pain or edema.    Note: Patient walked in office today - O2 sats remained above 91% for entire walk on RA - patient did become fatigued quickly    Allergies  Allergen Reactions  . Codeine Itching    Immunization History  Administered Date(s) Administered  . Influenza-Unspecified 06/02/2017, 03/14/2018    Past  Medical History:  Diagnosis Date  . Abnormal uterine bleeding   . Asthma   . Bronchitis   . Depression   . Endometriosis   . Headache(784.0)   . Obesity   . Uterine fibroid   . Vaginal Pap smear, abnormal    2017    Tobacco History: Social History   Tobacco Use  Smoking Status Never Smoker  Smokeless Tobacco Never Used   Counseling given: Yes   Outpatient Encounter Medications as of 07/09/2019  Medication Sig  . albuterol (VENTOLIN HFA) 108 (90 Base) MCG/ACT inhaler Inhale 2 puffs into the lungs every 6 (six) hours as needed for wheezing or shortness of breath.  . diclofenac Sodium (VOLTAREN) 1 % GEL Apply 2 g topically 3 (three) times daily as needed (Apply on the left chest wall as needed for pain). .  . montelukast (SINGULAIR) 10 MG tablet Take 1 tablet (10 mg total) by mouth at bedtime.  Marland Kitchen trimethoprim-polymyxin b (POLYTRIM) ophthalmic solution Place 2 drops into both eyes in the morning, at noon, and at bedtime. (Patient not taking: Reported on 07/09/2019)   No facility-administered encounter medications on file as of 07/09/2019.     Review of Systems  Review of Systems  Constitutional: Negative.  Negative for chills and fever.  HENT: Negative.   Respiratory: Positive for cough and shortness of breath.   Cardiovascular: Negative.  Negative for chest pain, palpitations and leg swelling.  Gastrointestinal: Negative.   Allergic/Immunologic: Negative.   Neurological: Negative.   Psychiatric/Behavioral: Negative.  Physical Exam  BP 102/72 (BP Location: Left Arm, Patient Position: Sitting, Cuff Size: Large)   Pulse 97   Temp (!) 97.3 F (36.3 C)   Wt 237 lb 8 oz (107.7 kg)   SpO2 96%   BMI 40.77 kg/m   Wt Readings from Last 5 Encounters:  07/09/19 237 lb 8 oz (107.7 kg)  07/02/19 240 lb 4.8 oz (109 kg)  06/20/19 270 lb (122.5 kg)  08/24/18 220 lb (99.8 kg)  04/05/18 225 lb (102.1 kg)     Physical Exam Vitals and nursing note reviewed.   Constitutional:      General: She is not in acute distress.    Appearance: She is well-developed.  Cardiovascular:     Rate and Rhythm: Normal rate and regular rhythm.  Pulmonary:     Effort: Pulmonary effort is normal.     Breath sounds: Normal breath sounds. No wheezing or rhonchi.  Musculoskeletal:     Right lower leg: No edema.     Left lower leg: No edema.  Neurological:     Mental Status: She is alert and oriented to person, place, and time.      Imaging: CT ANGIO CHEST PE W OR WO CONTRAST  Result Date: 06/30/2019 CLINICAL DATA:  Shortness of.  COVID-19 positive EXAM: CT ANGIOGRAPHY CHEST WITH CONTRAST TECHNIQUE: Multidetector CT imaging of the chest was performed using the standard protocol during bolus administration of intravenous contrast. Multiplanar CT image reconstructions and MIPs were obtained to evaluate the vascular anatomy. CONTRAST:  23mL OMNIPAQUE IOHEXOL 350 MG/ML SOLN COMPARISON:  June 27, 2019 FINDINGS: Cardiovascular: There is no demonstrable pulmonary embolus. There is no thoracic aortic aneurysm or dissection. Visualized great vessels appear normal. Right innominate and left common carotid arteries arise as a common trunk, an anatomic variant. Main pulmonary outflow tract measures 3.2 cm in diameter, prominent. Mediastinum/Nodes: Thyroid appears normal. No evident thoracic adenopathy. No esophageal lesions. Lungs/Pleura: There is ill-defined airspace opacity in each upper lobe, slightly more on the left than on the right. There is more extensive airspace opacity in each lower lobe, somewhat more on the left than on the right. No pleural effusions are evident. Upper Abdomen: Liver incompletely visualized but appears prominent. Visualized upper abdominal structures otherwise unremarkable. Musculoskeletal: No blastic or lytic bone lesions. No chest wall lesions evident. Review of the MIP images confirms the above findings. IMPRESSION: 1. No demonstrable pulmonary  embolus. No thoracic aortic aneurysm or dissection. 2. Multifocal pneumonia which may well be of atypical organism etiology, particularly given the history. 3.  No evident adenopathy. 4.  Suspect hepatomegaly with incomplete visualization of liver. Electronically Signed   By: Lowella Grip III M.D.   On: 06/30/2019 14:06   DG Chest Port 1 View  Result Date: 06/27/2019 CLINICAL DATA:  Shortness of breath EXAM: PORTABLE CHEST 1 VIEW COMPARISON:  Radiograph 06/23/2019 FINDINGS: Lung volumes are chronically diminished with nonspecific elevation of the left hemidiaphragm which is similar to comparison studies. However, there is new areas of mixed interstitial and consolidative opacity predominantly within the lung bases and particular within the left lung periphery. No pneumothorax is evident. Obscuration of the left costophrenic sulcus could suggest trace pleural fluid. Prominence of the cardiomediastinal silhouette may be related to the low volumes and portable technique. No acute osseous or soft tissue abnormality. Telemetry leads overlie the chest. IMPRESSION: Findings are concerning for multifocal pneumonia markedly worsened from comparison exam 06/23/2019. Electronically Signed   By: Lovena Le M.D.   On:  06/27/2019 22:01   DG Chest Port 1 View  Result Date: 06/23/2019 CLINICAL DATA:  44 year old female with fever. EXAM: PORTABLE CHEST 1 VIEW COMPARISON:  Chest radiograph dated 08/24/2018. FINDINGS: There is mild eventration of the left hemidiaphragm with minimal left lung base atelectasis. No focal consolidation, pleural effusion, pneumothorax. The cardiac silhouette patch that stable cardiac silhouette. No acute osseous pathology. IMPRESSION: No active disease. Electronically Signed   By: Anner Crete M.D.   On: 06/23/2019 18:55   ECHOCARDIOGRAM COMPLETE  Result Date: 06/30/2019    ECHOCARDIOGRAM REPORT   Patient Name:   Tracy Rosales Date of Exam: 06/30/2019 Medical Rec #:  [id number]  Height:        64.0 in Accession #:    [id number] Weight:       235.0 lb Date of Birth:  11-Apr-1975 BSA:          2.095 m Patient Age:    74 years   BP:           130/105 mmHg Patient Gender: F          HR:           87 bpm. 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                 COVID-19 Positive.  Sonographer:    Jonelle Sidle Dance Referring Phys: Graylin Shiver [facility]  Sonographer Comments: Technically difficult study due to poor echo windows and suboptimal subcostal window. IMPRESSIONS  1. Left ventricular ejection fraction, by estimation, is 60 to 65%. The left ventricle has normal function. The left ventricle has no regional wall motion abnormalities. Left ventricular diastolic parameters were normal.  2. Right ventricular systolic function is normal. The right ventricular size is normal. Tricuspid regurgitation signal is inadequate for assessing PA pressure.  3. The mitral valve is normal in structure. No evidence of mitral valve regurgitation. No evidence of mitral stenosis.  4. The aortic valve is normal in structure. Aortic valve regurgitation is not visualized. No aortic stenosis is present.  5. The inferior vena cava is normal in size with greater than 50% respiratory variability, suggesting right atrial pressure of 3 mmHg. FINDINGS  Left Ventricle: Left ventricular ejection fraction, by estimation, is 60 to 65%. The left ventricle has normal function. The left ventricle has no regional wall motion abnormalities. Definity contrast agent was given IV to delineate the left ventricular  endocardial borders. The left ventricular internal cavity size was normal in size. There is no left ventricular hypertrophy. Left ventricular diastolic parameters were normal. Right Ventricle: The right  ventricular size is normal. No increase in right ventricular wall thickness. Right ventricular systolic function is normal. Tricuspid regurgitation signal is inadequate for assessing PA pressure. Left Atrium: Left atrial size was normal in size. Right Atrium: Right atrial size was not well visualized. Pericardium: There is no evidence of pericardial effusion. Mitral Valve: The mitral valve is normal in structure. Normal mobility of the mitral valve leaflets. No evidence of mitral valve regurgitation. No evidence of mitral valve stenosis. Tricuspid Valve: The tricuspid valve is normal in structure. Tricuspid valve regurgitation is not demonstrated. No evidence of tricuspid stenosis. Aortic Valve: The aortic valve is normal  in structure. Aortic valve regurgitation is not visualized. No aortic stenosis is present. Pulmonic Valve: The pulmonic valve was not well visualized. Pulmonic valve regurgitation is trivial. No evidence of pulmonic stenosis. Aorta: The aortic root is normal in size and structure. Venous: The inferior vena cava was not well visualized. The inferior vena cava is normal in size with greater than 50% respiratory variability, suggesting right atrial pressure of 3 mmHg. IAS/Shunts: The interatrial septum was not well visualized. Additional Comments: Normal left main coronary artery origin.  LEFT VENTRICLE PLAX 2D LVIDd:         4.10 cm  Diastology LVIDs:         2.43 cm  LV e' lateral:   6.53 cm/s LV PW:         1.02 cm  LV E/e' lateral: 11.2 LV IVS:        0.70 cm  LV e' medial:    6.85 cm/s LVOT diam:     2.10 cm  LV E/e' medial:  10.7 LV SV:         74 LV SV Index:   35 LVOT Area:     3.46 cm  RIGHT VENTRICLE             IVC RV Basal diam:  2.51 cm     IVC diam: 1.57 cm RV S prime:     17.00 cm/s TAPSE (M-mode): 2.2 cm LEFT ATRIUM             Index       RIGHT ATRIUM          Index LA diam:        4.20 cm 2.00 cm/m  RA Area:     9.78 cm LA Vol (A2C):   54.3 ml 25.92 ml/m RA Volume:   18.70 ml  8.93 ml/m LA Vol (A4C):   43.5 ml 20.76 ml/m LA Biplane Vol: 48.6 ml 23.20 ml/m  AORTIC VALVE LVOT Vmax:   103.00 cm/s LVOT Vmean:  60.300 cm/s LVOT VTI:    0.214 m  AORTA Ao Root diam: 3.20 cm Ao Asc diam:  2.90 cm MITRAL VALVE MV Area (PHT): 2.69 cm    SHUNTS MV Decel Time: 282 msec    Systemic VTI:  0.21 m MV E velocity: 73.20 cm/s  Systemic Diam: 2.10 cm MV A velocity: 67.90 cm/s MV E/A ratio:  1.08 Cherlynn Kaiser MD Electronically signed by Cherlynn Kaiser MD Signature Date/Time: 06/30/2019/4:11:37 PM    Final      Assessment & Plan:   History of COVID-19 Covid Pneumonia History of asthma Shortness of breath:  Stable - slowly improving  May continue O2 at 2 L with exertion and RA at rest  Patient walked in office today - O2 sats remained above 91% for entire walk on RA - patient did become fatigued quickly  Continue albuterol as needed  Please start Symbicort 2 puffs twice daily - patient has this at home  Will order Singulair  Will repeat lab work and chest x ray in 2 weeks  Continue incentive spirometer and flutter valve   Cough:  - Start Prilosec daily -Use hard candies (sugarless Jolly Ranchers) or non-mint or non-menthol containing cough drops  - Use a cough suppressant (Delsym) around the clock for the next few days  Fatigue:  Increase activity as tolerated   Follow up:  2 weeks       Fenton Foy, NP 07/10/2019

## 2019-07-10 DIAGNOSIS — R05 Cough: Secondary | ICD-10-CM | POA: Insufficient documentation

## 2019-07-10 DIAGNOSIS — Z8616 Personal history of COVID-19: Secondary | ICD-10-CM | POA: Insufficient documentation

## 2019-07-10 DIAGNOSIS — R059 Cough, unspecified: Secondary | ICD-10-CM | POA: Insufficient documentation

## 2019-07-10 NOTE — Assessment & Plan Note (Signed)
Covid Pneumonia History of asthma Shortness of breath:  Stable - slowly improving  May continue O2 at 2 L with exertion and RA at rest  Patient walked in office today - O2 sats remained above 91% for entire walk on RA - patient did become fatigued quickly  Continue albuterol as needed  Please start Symbicort 2 puffs twice daily - patient has this at home  Will order Singulair  Will repeat lab work and chest x ray in 2 weeks  Continue incentive spirometer and flutter valve   Cough:  - Start Prilosec daily -Use hard candies (sugarless Jolly Ranchers) or non-mint or non-menthol containing cough drops  - Use a cough suppressant (Delsym) around the clock for the next few days  Fatigue:  Increase activity as tolerated   Follow up:  2 weeks

## 2019-07-23 ENCOUNTER — Ambulatory Visit: Payer: Self-pay

## 2019-10-23 ENCOUNTER — Ambulatory Visit (INDEPENDENT_AMBULATORY_CARE_PROVIDER_SITE_OTHER): Payer: 59 | Admitting: Sports Medicine

## 2019-10-23 ENCOUNTER — Ambulatory Visit: Payer: 59

## 2019-10-23 ENCOUNTER — Other Ambulatory Visit: Payer: Self-pay | Admitting: Sports Medicine

## 2019-10-23 ENCOUNTER — Encounter: Payer: Self-pay | Admitting: Sports Medicine

## 2019-10-23 ENCOUNTER — Other Ambulatory Visit: Payer: Self-pay

## 2019-10-23 ENCOUNTER — Ambulatory Visit (INDEPENDENT_AMBULATORY_CARE_PROVIDER_SITE_OTHER): Payer: 59

## 2019-10-23 DIAGNOSIS — M7661 Achilles tendinitis, right leg: Secondary | ICD-10-CM

## 2019-10-23 DIAGNOSIS — M19072 Primary osteoarthritis, left ankle and foot: Secondary | ICD-10-CM

## 2019-10-23 DIAGNOSIS — M79671 Pain in right foot: Secondary | ICD-10-CM | POA: Diagnosis not present

## 2019-10-23 DIAGNOSIS — M7662 Achilles tendinitis, left leg: Secondary | ICD-10-CM

## 2019-10-23 DIAGNOSIS — M79672 Pain in left foot: Secondary | ICD-10-CM | POA: Diagnosis not present

## 2019-10-23 DIAGNOSIS — M722 Plantar fascial fibromatosis: Secondary | ICD-10-CM

## 2019-10-23 DIAGNOSIS — M792 Neuralgia and neuritis, unspecified: Secondary | ICD-10-CM

## 2019-10-23 MED ORDER — GABAPENTIN 300 MG PO CAPS
300.0000 mg | ORAL_CAPSULE | Freq: Every day | ORAL | 3 refills | Status: DC
Start: 2019-10-23 — End: 2020-01-08

## 2019-10-23 NOTE — Progress Notes (Signed)
Subjective: Tracy Rosales is a 44 y.o. female patient who returns to office for follow up eval of R>L foot/heel pain. Reports that pain is still the same with increased burning pain to both feet for the last 2-3 weeks, pain worse with walking or pressure. Patient is concerned about work in the future and wants to go back to [place] but is concerned on how her foot will react.  No other issues noted.   Patient Active Problem List   Diagnosis Date Noted  . History of COVID-19 07/10/2019  . Cough 07/10/2019  . Pneumonia due to COVID-19 virus 06/28/2019  . Acute respiratory failure with hypoxia ([place]) 06/28/2019  . Morbid obesity ([place]) 06/28/2019  . Obesity (BMI 30-39.9) 05/30/2017  . Family history of diabetes mellitus in mother 05/30/2017  . History of PID 05/30/2017  . Endometriosis 05/30/2017  . Fibroids 05/30/2017  . Female dyspareunia 05/30/2017  . Mild intermittent asthma 05/30/2017  . BV (bacterial vaginosis) 05/30/2017  . Pelvic pain in female 10/13/2011    Current Outpatient Medications on File Prior to Visit  Medication Sig Dispense Refill  . albuterol (VENTOLIN HFA) 108 (90 Base) MCG/ACT inhaler Inhale 2 puffs into the lungs every 6 (six) hours as needed for wheezing or shortness of breath. 6.7 g 0  . diclofenac Sodium (VOLTAREN) 1 % GEL Apply 2 g topically 3 (three) times daily as needed (Apply on the left chest wall as needed for pain). . 2 g 0  . montelukast (SINGULAIR) 10 MG tablet Take 1 tablet (10 mg total) by mouth at bedtime. 30 tablet 3  . trimethoprim-polymyxin b (POLYTRIM) ophthalmic solution Place 2 drops into both eyes in the morning, at noon, and at bedtime. 10 mL 0   No current facility-administered medications on file prior to visit.    Allergies  Allergen Reactions  . Codeine Itching   Family History  Problem Relation Age of Onset  . Diabetes Mother   . Hypertension Mother   . Thyroid disease Maternal Aunt   . Uterine cancer Cousin   . Migraines  Son     Social History   Socioeconomic History  . Marital status: Single    Spouse name: Not on file  . Number of children: Not on file  . Years of education: Not on file  . Highest education level: Not on file  Occupational History  . Not on file  Tobacco Use  . Smoking status: Never Smoker  . Smokeless tobacco: Never Used  Vaping Use  . Vaping Use: Never used  Substance and Sexual Activity  . Alcohol use: Yes    Alcohol/week: 2.0 standard drinks    Types: 1 Glasses of wine, 1 Cans of beer per week    Comment: pt reports drinking 3-4 drinks per week  . Drug use: No  . Sexual activity: Yes    Partners: Male    Birth control/protection: None  Other Topics Concern  . Not on file  Social History Narrative  . Not on file   Social Determinants of Health   Financial Resource Strain:   . Difficulty of Paying Living Expenses: Not on file  Food Insecurity:   . Worried About Charity fundraiser in the Last Year: Not on file  . Ran Out of Food in the Last Year: Not on file  Transportation Needs:   . Lack of Transportation (Medical): Not on file  . Lack of Transportation (Non-Medical): Not on file  Physical Activity:   . Days  of Exercise per Week: Not on file  . Minutes of Exercise per Session: Not on file  Stress:   . Feeling of Stress : Not on file  Social Connections:   . Frequency of Communication with Friends and Family: Not on file  . Frequency of Social Gatherings with Friends and Family: Not on file  . Attends Religious Services: Not on file  . Active Member of Clubs or Organizations: Not on file  . Attends Archivist Meetings: Not on file  . Marital Status: Not on file   Past Surgical History:  Procedure Laterality Date  . CESAREAN SECTION    . DILATION AND CURETTAGE OF UTERUS     for heavy periods     Objective:  General: Alert and oriented x3 in no acute distress  Dermatology: No open lesions bilateral lower extremities, no webspace  macerations, no ecchymosis bilateral, all nails x 10 are well manicured.  Vascular: Dorsalis Pedis and Posterior Tibial pedal pulses 1/4, Capillary Fill Time 3 seconds, + pedal hair growth bilateral, no edema bilateral lower extremities, Temperature gradient within normal limits.  Neurology: Johney Maine sensation intact via light touch bilateral.   Musculoskeletal: Mild to Moderate tenderness with palpation at insertion of the Achilles on Right>Left at lateral insertion, no reproducible tenderness at plantar fascial bilateral but there is pain to the dorsum of the left foot consistent with arthritis with dorsal exostosis.  There is calcaneal exostosis with mild soft tissue present and decreased ankle rom with knee extending  vs flexed resembling gastroc equnius bilateral, The achilles tendon feels intact with no nodularity or palpable dell, Thompson sign negative, Subtalar and midtarsal joint range of motion is within normal limits, there is no 1st ray hypermobility or forefoot deformity noted bilateral. + Pes planus.   Xray consistent with arthritis on left midfoot and severe posterior and inferior exostosis with fracture  Assessment and Plan: Problem List Items Addressed This Visit    None    Visit Diagnoses    Left foot pain    -  Primary   Tendonitis, Achilles, right       Relevant Orders   DG Foot Complete Right (Completed)   Right foot pain       Inflammatory pain of right heel       Arthritis of left foot       Tendonitis, Achilles, left       Plantar fasciitis, bilateral       Neuritis         -Complete examination performed -Xrays reviewed  -Discuss treatment options for chronic foot pain bilateral R>L  -Rx Gabapentin for pain and neuritis  -Continue with bilateral night splints and gentle stretching a combination of heat and ice and topical pain creams and rubs as needed  -Advised patient next visit we will discuss surgical plan for right foot/ankle that likely will involved  achilles tendon lengthening, exostectomy, and plantar fascial release with NWB for 4 weeks and strict compliance with follow up -Advised patient re: return to work to wait until after surgery and recovery 3 months.  Landis Martins, DPM

## 2019-10-30 ENCOUNTER — Ambulatory Visit: Payer: Self-pay

## 2019-11-04 ENCOUNTER — Ambulatory Visit: Payer: Self-pay

## 2019-11-06 ENCOUNTER — Encounter: Payer: Self-pay | Admitting: Sports Medicine

## 2019-11-06 ENCOUNTER — Ambulatory Visit (INDEPENDENT_AMBULATORY_CARE_PROVIDER_SITE_OTHER): Payer: 59 | Admitting: Sports Medicine

## 2019-11-06 ENCOUNTER — Other Ambulatory Visit: Payer: Self-pay

## 2019-11-06 DIAGNOSIS — M79671 Pain in right foot: Secondary | ICD-10-CM | POA: Diagnosis not present

## 2019-11-06 DIAGNOSIS — M7661 Achilles tendinitis, right leg: Secondary | ICD-10-CM | POA: Diagnosis not present

## 2019-11-06 NOTE — Progress Notes (Signed)
Subjective: Kynsie Falkner is a 44 y.o. female patient who returns to office for follow up eval of R>L foot/heel pain. Reports that pain is still the same with increased burning pain to top of right foot.  No other issues noted.   Patient Active Problem List   Diagnosis Date Noted  . History of COVID-19 07/10/2019  . Cough 07/10/2019  . Pneumonia due to COVID-19 virus 06/28/2019  . Acute respiratory failure with hypoxia ([place]) 06/28/2019  . Morbid obesity ([place]) 06/28/2019  . Obesity (BMI 30-39.9) 05/30/2017  . Family history of diabetes mellitus in mother 05/30/2017  . History of PID 05/30/2017  . Endometriosis 05/30/2017  . Fibroids 05/30/2017  . Female dyspareunia 05/30/2017  . Mild intermittent asthma 05/30/2017  . BV (bacterial vaginosis) 05/30/2017  . Pelvic pain in female 10/13/2011    Current Outpatient Medications on File Prior to Visit  Medication Sig Dispense Refill  . albuterol (VENTOLIN HFA) 108 (90 Base) MCG/ACT inhaler Inhale 2 puffs into the lungs every 6 (six) hours as needed for wheezing or shortness of breath. 6.7 g 0  . diclofenac Sodium (VOLTAREN) 1 % GEL Apply 2 g topically 3 (three) times daily as needed (Apply on the left chest wall as needed for pain). . 2 g 0  . gabapentin (NEURONTIN) 300 MG capsule Take 1 capsule (300 mg total) by mouth at bedtime. 90 capsule 3  . montelukast (SINGULAIR) 10 MG tablet Take 1 tablet (10 mg total) by mouth at bedtime. 30 tablet 3  . trimethoprim-polymyxin b (POLYTRIM) ophthalmic solution Place 2 drops into both eyes in the morning, at noon, and at bedtime. 10 mL 0   No current facility-administered medications on file prior to visit.    Allergies  Allergen Reactions  . Codeine Itching   Family History  Problem Relation Age of Onset  . Diabetes Mother   . Hypertension Mother   . Thyroid disease Maternal Aunt   . Uterine cancer Cousin   . Migraines Son     Social History   Socioeconomic History  . Marital  status: Single    Spouse name: Not on file  . Number of children: Not on file  . Years of education: Not on file  . Highest education level: Not on file  Occupational History  . Not on file  Tobacco Use  . Smoking status: Never Smoker  . Smokeless tobacco: Never Used  Vaping Use  . Vaping Use: Never used  Substance and Sexual Activity  . Alcohol use: Yes    Alcohol/week: 2.0 standard drinks    Types: 1 Glasses of wine, 1 Cans of beer per week    Comment: pt reports drinking 3-4 drinks per week  . Drug use: No  . Sexual activity: Yes    Partners: Male    Birth control/protection: None  Other Topics Concern  . Not on file  Social History Narrative  . Not on file   Social Determinants of Health   Financial Resource Strain:   . Difficulty of Paying Living Expenses: Not on file  Food Insecurity:   . Worried About Charity fundraiser in the Last Year: Not on file  . Ran Out of Food in the Last Year: Not on file  Transportation Needs:   . Lack of Transportation (Medical): Not on file  . Lack of Transportation (Non-Medical): Not on file  Physical Activity:   . Days of Exercise per Week: Not on file  . Minutes of Exercise per  Session: Not on file  Stress:   . Feeling of Stress : Not on file  Social Connections:   . Frequency of Communication with Friends and Family: Not on file  . Frequency of Social Gatherings with Friends and Family: Not on file  . Attends Religious Services: Not on file  . Active Member of Clubs or Organizations: Not on file  . Attends Archivist Meetings: Not on file  . Marital Status: Not on file   Past Surgical History:  Procedure Laterality Date  . CESAREAN SECTION    . DILATION AND CURETTAGE OF UTERUS     for heavy periods     Objective:  General: Alert and oriented x3 in no acute distress  Dermatology: No open lesions bilateral lower extremities, no webspace macerations, no ecchymosis bilateral, all nails x 10 are well  manicured.  Vascular: Dorsalis Pedis and Posterior Tibial pedal pulses 1/4, Capillary Fill Time 3 seconds, + pedal hair growth bilateral, no edema bilateral lower extremities, Temperature gradient within normal limits.  Neurology: Johney Maine sensation intact via light touch bilateral.   Musculoskeletal: Mild to Moderate tenderness with palpation at insertion of the Achilles on Right>Left at lateral insertion, no reproducible tenderness at plantar fascial bilateral but there is pain to the dorsum of the left foot consistent with arthritis with dorsal exostosis.  There is calcaneal exostosis with mild soft tissue present and decreased ankle rom with knee extending  vs flexed resembling gastroc equnius bilateral, The achilles tendon feels intact with no nodularity or palpable dell, Thompson sign negative, Subtalar and midtarsal joint range of motion is within normal limits, there is no 1st ray hypermobility or forefoot deformity noted bilateral. + Pes planus.   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 Consent obtained for right achilles repair, removal of heel spur, and plantar fascial release. Pre and Post op course explained. Risks, benefits, alternatives explained. No guarantees given or implied. Surgical booking slip submitted and provided patient with Surgical packet and info for [facility] -Patient to be nonweightbearing with use of Crutches -Advised patient return after surgery   Landis Martins, DPM

## 2019-11-19 ENCOUNTER — Telehealth: Payer: Self-pay

## 2019-11-19 NOTE — Telephone Encounter (Signed)
DOS 11/24/2019  EPF RT - 66196 REPAIR TENDON RT - 28086 CALCANEAL OSTECTOMY RT - 28118  [facility] FROM [facility] STATING NO AUTHORIZATION REQUIRED FOR CPT [id number], [phone] OR 198[address]  REF# [id number]

## 2019-11-23 ENCOUNTER — Other Ambulatory Visit: Payer: Self-pay | Admitting: Sports Medicine

## 2019-11-23 NOTE — Progress Notes (Signed)
Post op meds entered 

## 2019-11-24 ENCOUNTER — Encounter: Payer: Self-pay | Admitting: Sports Medicine

## 2019-11-24 DIAGNOSIS — M722 Plantar fascial fibromatosis: Secondary | ICD-10-CM

## 2019-11-24 DIAGNOSIS — M7661 Achilles tendinitis, right leg: Secondary | ICD-10-CM

## 2019-11-24 DIAGNOSIS — M7731 Calcaneal spur, right foot: Secondary | ICD-10-CM

## 2019-11-24 MED ORDER — HYDROMORPHONE HCL 2 MG PO TABS: 2.0000 mg | ORAL_TABLET | Freq: Four times a day (QID) | ORAL | 0 refills | Status: AC | PRN

## 2019-11-24 MED ORDER — IBUPROFEN 800 MG PO TABS: 800.0000 mg | ORAL_TABLET | Freq: Three times a day (TID) | ORAL | 0 refills | Status: AC | PRN

## 2019-11-24 MED ORDER — DOCUSATE SODIUM 100 MG PO CAPS: 100.0000 mg | ORAL_CAPSULE | Freq: Two times a day (BID) | ORAL | 0 refills | Status: AC

## 2019-11-24 MED ORDER — PROMETHAZINE HCL 12.5 MG PO TABS: 12.5000 mg | ORAL_TABLET | Freq: Three times a day (TID) | ORAL | 0 refills | Status: AC | PRN

## 2019-11-25 ENCOUNTER — Telehealth: Payer: Self-pay

## 2019-11-25 ENCOUNTER — Telehealth: Payer: Self-pay | Admitting: Sports Medicine

## 2019-11-25 NOTE — Telephone Encounter (Signed)
Ok thanks once it is authorized then they will contact her to let her know that's is approved

## 2019-11-25 NOTE — Telephone Encounter (Signed)
Pt had surgery yesterday with Dr. Cannon Kettle. Pt called and would like to know what medical equipment she could get with her insurance in other to help her go to the bathroom. Please advise

## 2019-11-25 NOTE — Telephone Encounter (Signed)
She can try the catheters that she previously messaged about or she can get a bedside commode/toilet

## 2019-11-25 NOTE — Telephone Encounter (Signed)
Pt called her insurance about getting a catheter and her insurance said it needs to be preauthorized.

## 2019-11-25 NOTE — Telephone Encounter (Signed)
Post op check this morning.  Advised her that she needs to call her insurance company to see if they will cover the pruritic catheters versus a bedside commode.  I told her if it is covered and she wants to pick it up at a DME store to call office for me to send prescription on her behalf.  I also advised patient to continue with rest ice elevation taking medications as prescribed.  Patient reports that pain is excruciating I advised patient to take an additional Dilaudid to equal 4 mg and to pick up over-the-counter extra strength Tylenol to take in addition to her pain medicine for additional relief.  Patient is breast understanding and advised her to call office back if there are any problems or concerns.

## 2019-11-26 ENCOUNTER — Other Ambulatory Visit: Payer: Self-pay | Admitting: Sports Medicine

## 2019-11-26 ENCOUNTER — Telehealth: Payer: Self-pay | Admitting: Sports Medicine

## 2019-11-26 ENCOUNTER — Telehealth: Payer: Self-pay | Admitting: *Deleted

## 2019-11-26 DIAGNOSIS — M7661 Achilles tendinitis, right leg: Secondary | ICD-10-CM

## 2019-11-26 DIAGNOSIS — R3915 Urgency of urination: Secondary | ICD-10-CM

## 2019-11-26 DIAGNOSIS — R2681 Unsteadiness on feet: Secondary | ICD-10-CM

## 2019-11-26 DIAGNOSIS — Z9889 Other specified postprocedural states: Secondary | ICD-10-CM

## 2019-11-26 DIAGNOSIS — M79671 Pain in right foot: Secondary | ICD-10-CM

## 2019-11-26 NOTE — Telephone Encounter (Signed)
Pt needs a "peerwick" (sp?) order faxed over to [facility] at [phone]

## 2019-11-26 NOTE — Telephone Encounter (Signed)
Pt needs a "peerwick" (sp?) order faxed over to [facility] at [phone] not over to [facility]. Pt stated that [facility] has not received it

## 2019-11-26 NOTE — Telephone Encounter (Signed)
I already faxed an order over this morning to [facility].

## 2019-11-26 NOTE — Telephone Encounter (Signed)
Informed patient of orders to [facility] and for PureWick

## 2019-11-26 NOTE — Telephone Encounter (Signed)
Patient called stating authorization was needed for the PureWick catheter.

## 2019-11-26 NOTE — Telephone Encounter (Signed)
I can request it but there is no ganurantee that we can get an agency to come to see her since she does not have an open wound or dressings to change. I recommend for her to ask a family member or friend to come by to help her until we can check with a home health agency -Dr. Chauncey Cruel

## 2019-11-26 NOTE — Telephone Encounter (Signed)
Patient called requesting orders to be sent in for [facility]. She is having trouble caring for herself postoperatively. Please advise

## 2019-11-26 NOTE — Telephone Encounter (Signed)
Our fax machine is not working here is Technical sales engineer. She will have to wait for it to be faxed tomorrow -Dr. Cannon Kettle

## 2019-11-26 NOTE — Progress Notes (Signed)
Rx sent to [facility] for Pure [facility] Catheters or bedside commode to aid patient with bathroom needs while post op

## 2019-11-26 NOTE — Telephone Encounter (Signed)
Returned call - let patient know an order was put into the computer for the catheter. She said Dr. Cannon Kettle told her to go to a medical supply store. I gave patient two locations for medical supply in [place] and she will contact them and see if they have this item or can order it for her. I will fax over the order/prescription for the device if necessary for purchasing. She will call me back and let me know.  I also let her know the referral was put in for Home Health, just waiting on orders from Dr. Cannon Kettle to complete the request.

## 2019-11-26 NOTE — Progress Notes (Signed)
Referal placed for [facility]

## 2019-11-27 ENCOUNTER — Telehealth: Payer: Self-pay | Admitting: Sports Medicine

## 2019-11-27 NOTE — Telephone Encounter (Signed)
Patient fell and hit her surgery foot and is in excruciating pain. She doesn't know what to do.

## 2019-11-27 NOTE — Telephone Encounter (Signed)
FYI  I called patient and gave her instructions on rest,ice,elevation, and taking her meds as Rx and to try melatonin for sleep. Patient bumped  her foot and the pain has eased up so no need for her to go to ER. Advised patient to call back if there are any more concerns Patient thanked me for calling. -Dr. Cannon Kettle

## 2019-12-02 ENCOUNTER — Telehealth: Payer: Self-pay

## 2019-12-02 NOTE — Telephone Encounter (Signed)
Ok thanks 

## 2019-12-02 NOTE — Telephone Encounter (Signed)
Tim from [facility] at [facility] called today and state that they are unable to staff this referral that this time due to staffing issues.

## 2019-12-04 ENCOUNTER — Encounter: Payer: Self-pay | Admitting: Sports Medicine

## 2019-12-04 ENCOUNTER — Other Ambulatory Visit: Payer: Self-pay

## 2019-12-04 ENCOUNTER — Ambulatory Visit (INDEPENDENT_AMBULATORY_CARE_PROVIDER_SITE_OTHER): Payer: 59

## 2019-12-04 ENCOUNTER — Ambulatory Visit (INDEPENDENT_AMBULATORY_CARE_PROVIDER_SITE_OTHER): Payer: 59 | Admitting: Sports Medicine

## 2019-12-04 DIAGNOSIS — M722 Plantar fascial fibromatosis: Secondary | ICD-10-CM

## 2019-12-04 DIAGNOSIS — M7661 Achilles tendinitis, right leg: Secondary | ICD-10-CM

## 2019-12-04 DIAGNOSIS — M79671 Pain in right foot: Secondary | ICD-10-CM

## 2019-12-04 DIAGNOSIS — Z9889 Other specified postprocedural states: Secondary | ICD-10-CM

## 2019-12-04 MED ORDER — IBUPROFEN 800 MG PO TABS: 800.0000 mg | ORAL_TABLET | Freq: Three times a day (TID) | ORAL | 0 refills | Status: AC | PRN

## 2019-12-04 MED ORDER — HYDROMORPHONE HCL 4 MG PO TABS: 4.0000 mg | ORAL_TABLET | Freq: Four times a day (QID) | ORAL | 0 refills | Status: AC | PRN

## 2019-12-04 NOTE — Progress Notes (Addendum)
Subjective: Tracy Rosales is a 44 y.o. female patient seen today in office for POV #1 (DOS 11/24/19), S/P R removal of heel spur, achilles repair, and EPF. Patient admits pain at surgical site that is improving, denies calf pain, denies headache, chest pain, shortness of breath, nausea, vomiting, fever, or chills.  No other issues noted.   Patient Active Problem List   Diagnosis Date Noted  . History of COVID-19 07/10/2019  . Cough 07/10/2019  . Pneumonia due to COVID-19 virus 06/28/2019  . Acute respiratory failure with hypoxia ([place]) 06/28/2019  . Morbid obesity ([place]) 06/28/2019  . Obesity (BMI 30-39.9) 05/30/2017  . Family history of diabetes mellitus in mother 05/30/2017  . History of PID 05/30/2017  . Endometriosis 05/30/2017  . Fibroids 05/30/2017  . Female dyspareunia 05/30/2017  . Mild intermittent asthma 05/30/2017  . BV (bacterial vaginosis) 05/30/2017  . Pelvic pain in female 10/13/2011    Current Outpatient Medications on File Prior to Visit  Medication Sig Dispense Refill  . albuterol (VENTOLIN HFA) 108 (90 Base) MCG/ACT inhaler Inhale 2 puffs into the lungs every 6 (six) hours as needed for wheezing or shortness of breath. 6.7 g 0  . diclofenac Sodium (VOLTAREN) 1 % GEL Apply 2 g topically 3 (three) times daily as needed (Apply on the left chest wall as needed for pain). . 2 g 0  . docusate sodium (COLACE) 100 MG capsule Take 1 capsule (100 mg total) by mouth 2 (two) times daily. 10 capsule 0  . gabapentin (NEURONTIN) 300 MG capsule Take 1 capsule (300 mg total) by mouth at bedtime. 90 capsule 3  . ibuprofen (ADVIL) 800 MG tablet Take 1 tablet (800 mg total) by mouth every 8 (eight) hours as needed. 30 tablet 0  . montelukast (SINGULAIR) 10 MG tablet Take 1 tablet (10 mg total) by mouth at bedtime. 30 tablet 3  . promethazine (PHENERGAN) 12.5 MG tablet Take 1 tablet (12.5 mg total) by mouth every 8 (eight) hours as needed for nausea or vomiting. 20 tablet 0  .  trimethoprim-polymyxin b (POLYTRIM) ophthalmic solution Place 2 drops into both eyes in the morning, at noon, and at bedtime. 10 mL 0   No current facility-administered medications on file prior to visit.    Allergies  Allergen Reactions  . Codeine Itching    Objective: There were no vitals filed for this visit.  General: No acute distress, AAOx3  Right foot: Staples and Sutures intact with no gapping or dehiscence at surgical site, mild swelling to right foot, no erythema, no warmth, no drainage, no signs of infection noted, Capillary fill time <3 seconds in all digits, gross sensation present via light touch to right foot. No pain or crepitation with range of motion right foot.  No pain with calf compression.   Post Op Xray, Right foot: Reduction of bone spur. Soft tissue swelling within normal limits for post op status.   Assessment and Plan:  Problem List Items Addressed This Visit    None    Visit Diagnoses    Tendonitis, Achilles, right    -  Primary   Relevant Orders   DG Foot Complete Left   S/P foot surgery, right       Right foot pain       Plantar fasciitis, right          -Patient seen and evaluated -Applied unna boot and dry sterile dressing to surgical site right foot secured with ACE wrap and stockinet  -Advised  patient to make sure to keep dressings clean, dry, and intact to right -Advised patient to continue with NWB with use of crutches or knee scooter  -Home nursing unable to see patient at this time due to staffing but [facility] with ADLs -Advised patient to limit activity to necessity  -Advised patient to ice and elevate  -Will plan for staples and suture removal at next office visit. In the meantime, patient to call office if any issues or problems arise.   Landis Martins, DPM

## 2019-12-04 NOTE — Progress Notes (Signed)
DG  °

## 2019-12-11 ENCOUNTER — Ambulatory Visit (INDEPENDENT_AMBULATORY_CARE_PROVIDER_SITE_OTHER): Payer: 59 | Admitting: Sports Medicine

## 2019-12-11 ENCOUNTER — Other Ambulatory Visit: Payer: Self-pay

## 2019-12-11 ENCOUNTER — Encounter: Payer: Self-pay | Admitting: Sports Medicine

## 2019-12-11 DIAGNOSIS — Z9889 Other specified postprocedural states: Secondary | ICD-10-CM

## 2019-12-11 DIAGNOSIS — M722 Plantar fascial fibromatosis: Secondary | ICD-10-CM

## 2019-12-11 DIAGNOSIS — M79671 Pain in right foot: Secondary | ICD-10-CM

## 2019-12-11 DIAGNOSIS — M7661 Achilles tendinitis, right leg: Secondary | ICD-10-CM

## 2019-12-11 MED ORDER — METAXALONE 400 MG PO TABS: 400.0000 mg | ORAL_TABLET | Freq: Three times a day (TID) | ORAL | 2 refills | Status: AC

## 2019-12-11 NOTE — Progress Notes (Signed)
Subjective: Tracy Rosales is a 44 y.o. female patient seen today in office for POV #2 (DOS 11/24/19), S/P R removal of heel spur, achilles repair, and EPF. Patient admits pain at surgical site that is improving but wants a muscle relaxer, reports that she could not tolerate the unna boot, rubbing her skin, denies calf pain, denies headache, chest pain, shortness of breath, nausea, vomiting, fever, or chills.  No other issues noted.   Patient Active Problem List   Diagnosis Date Noted  . History of COVID-19 07/10/2019  . Cough 07/10/2019  . Pneumonia due to COVID-19 virus 06/28/2019  . Acute respiratory failure with hypoxia ([place]) 06/28/2019  . Morbid obesity ([place]) 06/28/2019  . Obesity (BMI 30-39.9) 05/30/2017  . Family history of diabetes mellitus in mother 05/30/2017  . History of PID 05/30/2017  . Endometriosis 05/30/2017  . Fibroids 05/30/2017  . Female dyspareunia 05/30/2017  . Mild intermittent asthma 05/30/2017  . BV (bacterial vaginosis) 05/30/2017  . Pelvic pain in female 10/13/2011    Current Outpatient Medications on File Prior to Visit  Medication Sig Dispense Refill  . albuterol (VENTOLIN HFA) 108 (90 Base) MCG/ACT inhaler Inhale 2 puffs into the lungs every 6 (six) hours as needed for wheezing or shortness of breath. 6.7 g 0  . diclofenac Sodium (VOLTAREN) 1 % GEL Apply 2 g topically 3 (three) times daily as needed (Apply on the left chest wall as needed for pain). . 2 g 0  . docusate sodium (COLACE) 100 MG capsule Take 1 capsule (100 mg total) by mouth 2 (two) times daily. 10 capsule 0  . gabapentin (NEURONTIN) 300 MG capsule Take 1 capsule (300 mg total) by mouth at bedtime. 90 capsule 3  . HYDROmorphone (DILAUDID) 4 MG tablet Take 1 tablet (4 mg total) by mouth every 6 (six) hours as needed for up to 7 days for severe pain. 28 tablet 0  . ibuprofen (ADVIL) 800 MG tablet Take 1 tablet (800 mg total) by mouth every 8 (eight) hours as needed. 30 tablet 0  . ibuprofen  (ADVIL) 800 MG tablet Take 1 tablet (800 mg total) by mouth every 8 (eight) hours as needed. 30 tablet 0  . montelukast (SINGULAIR) 10 MG tablet Take 1 tablet (10 mg total) by mouth at bedtime. 30 tablet 3  . promethazine (PHENERGAN) 12.5 MG tablet Take 1 tablet (12.5 mg total) by mouth every 8 (eight) hours as needed for nausea or vomiting. 20 tablet 0  . trimethoprim-polymyxin b (POLYTRIM) ophthalmic solution Place 2 drops into both eyes in the morning, at noon, and at bedtime. 10 mL 0   No current facility-administered medications on file prior to visit.    Allergies  Allergen Reactions  . Codeine Itching    Objective: There were no vitals filed for this visit.  General: No acute distress, AAOx3  Right foot: Staples and Sutures intact with no gapping or dehiscence at surgical site, mild swelling to right foot, no erythema, no warmth, no drainage, no signs of infection noted, Capillary fill time <3 seconds in all digits, gross sensation present via light touch to right foot. No pain or crepitation with range of motion right foot.  No pain with calf compression.   Assessment and Plan:  Problem List Items Addressed This Visit    None    Visit Diagnoses    Tendonitis, Achilles, right    -  Primary   Plantar fasciitis, right       S/P foot surgery, right  Right foot pain          -Patient seen and evaluated -Applied cast padding and dry sterile dressing to surgical site right foot secured with ACE wrap and stockinet  -Advised patient to make sure to keep dressings clean, dry, and intact to right -Advised patient to continue with NWB with use of crutches or knee scooter; encouraged strict NWB -Home nursing unable to see patient at this time due to staffing but will [facility] for [facility] with ADLs; office will refax orders -Advised patient to limit activity to necessity  -Advised patient to ice and elevate  -Rx methacarbamol for spasms -Will plan for  xrays, staples and suture removal, weightbearing with CAM boot at next office visit. In the meantime, patient to call office if any issues or problems arise.   Landis Martins, DPM

## 2019-12-18 ENCOUNTER — Other Ambulatory Visit: Payer: Self-pay | Admitting: Sports Medicine

## 2019-12-18 DIAGNOSIS — Z9889 Other specified postprocedural states: Secondary | ICD-10-CM

## 2019-12-18 NOTE — Progress Notes (Signed)
Rx [place] sent to [facility]

## 2019-12-25 ENCOUNTER — Ambulatory Visit (INDEPENDENT_AMBULATORY_CARE_PROVIDER_SITE_OTHER): Payer: 59 | Admitting: Sports Medicine

## 2019-12-25 ENCOUNTER — Ambulatory Visit (INDEPENDENT_AMBULATORY_CARE_PROVIDER_SITE_OTHER): Payer: 59

## 2019-12-25 ENCOUNTER — Encounter: Payer: 59 | Admitting: Sports Medicine

## 2019-12-25 ENCOUNTER — Other Ambulatory Visit: Payer: Self-pay

## 2019-12-25 ENCOUNTER — Encounter: Payer: Self-pay | Admitting: Sports Medicine

## 2019-12-25 DIAGNOSIS — M79671 Pain in right foot: Secondary | ICD-10-CM

## 2019-12-25 DIAGNOSIS — M7661 Achilles tendinitis, right leg: Secondary | ICD-10-CM

## 2019-12-25 DIAGNOSIS — Z9889 Other specified postprocedural states: Secondary | ICD-10-CM

## 2019-12-25 DIAGNOSIS — M7731 Calcaneal spur, right foot: Secondary | ICD-10-CM

## 2019-12-25 DIAGNOSIS — M722 Plantar fascial fibromatosis: Secondary | ICD-10-CM

## 2019-12-25 MED ORDER — IBUPROFEN 800 MG PO TABS: 800.0000 mg | ORAL_TABLET | Freq: Three times a day (TID) | ORAL | 0 refills | Status: DC | PRN

## 2019-12-25 NOTE — Progress Notes (Signed)
Subjective: Tracy Rosales is a 44 y.o. female patient seen today in office for POV #3 (DOS 11/24/19), S/P R removal of heel spur, achilles repair, and EPF. Patient admits pain at surgical site that is getting better but still some throbbing with burning pain, ready to walk, denies calf pain, denies headache, chest pain, shortness of breath, nausea, vomiting, fever, or chills.  No other issues noted.   Patient Active Problem List   Diagnosis Date Noted  . History of COVID-19 07/10/2019  . Cough 07/10/2019  . Pneumonia due to COVID-19 virus 06/28/2019  . Acute respiratory failure with hypoxia ([place]) 06/28/2019  . Morbid obesity ([place]) 06/28/2019  . Obesity (BMI 30-39.9) 05/30/2017  . Family history of diabetes mellitus in mother 05/30/2017  . History of PID 05/30/2017  . Endometriosis 05/30/2017  . Fibroids 05/30/2017  . Female dyspareunia 05/30/2017  . Mild intermittent asthma 05/30/2017  . BV (bacterial vaginosis) 05/30/2017  . Pelvic pain in female 10/13/2011    Current Outpatient Medications on File Prior to Visit  Medication Sig Dispense Refill  . albuterol (VENTOLIN HFA) 108 (90 Base) MCG/ACT inhaler Inhale 2 puffs into the lungs every 6 (six) hours as needed for wheezing or shortness of breath. 6.7 g 0  . diclofenac Sodium (VOLTAREN) 1 % GEL Apply 2 g topically 3 (three) times daily as needed (Apply on the left chest wall as needed for pain). . 2 g 0  . docusate sodium (COLACE) 100 MG capsule Take 1 capsule (100 mg total) by mouth 2 (two) times daily. 10 capsule 0  . gabapentin (NEURONTIN) 300 MG capsule Take 1 capsule (300 mg total) by mouth at bedtime. 90 capsule 3  . ibuprofen (ADVIL) 800 MG tablet Take 1 tablet (800 mg total) by mouth every 8 (eight) hours as needed. 30 tablet 0  . ibuprofen (ADVIL) 800 MG tablet Take 1 tablet (800 mg total) by mouth every 8 (eight) hours as needed. 30 tablet 0  . metaxalone (SKELAXIN) 400 MG tablet Take 1 tablet (400 mg total) by mouth 3  (three) times daily. 30 tablet 2  . montelukast (SINGULAIR) 10 MG tablet Take 1 tablet (10 mg total) by mouth at bedtime. 30 tablet 3  . promethazine (PHENERGAN) 12.5 MG tablet Take 1 tablet (12.5 mg total) by mouth every 8 (eight) hours as needed for nausea or vomiting. 20 tablet 0  . trimethoprim-polymyxin b (POLYTRIM) ophthalmic solution Place 2 drops into both eyes in the morning, at noon, and at bedtime. 10 mL 0   No current facility-administered medications on file prior to visit.    Allergies  Allergen Reactions  . Codeine Itching    Objective: There were no vitals filed for this visit.  General: No acute distress, AAOx3  Right foot: Staples and Sutures intact with no gapping or dehiscence at surgical site, mild swelling to right foot, no erythema, no warmth, no drainage, no signs of infection noted, Capillary fill time <3 seconds in all digits, gross sensation present via light touch to right foot. No pain or crepitation with range of motion right foot.  No pain with calf compression.   Xrays consistent with post op status and reduction of posterior heel spur  Assessment and Plan:  Problem List Items Addressed This Visit    None    Visit Diagnoses    Tendonitis, Achilles, right    -  Primary   Relevant Orders   DG Foot Complete Right   Plantar fasciitis, right  Heel spur, right       S/P foot surgery, right       Right foot pain          -Patient seen and evaluated -Xrays reviewed -Sutures and a few staples removed -Applied dry sterile dressing to surgical site right foot secured with ACE wrap and stockinet  -Patient may redress with ACE wrap but should refrain from getting surgical sites wet until she can get all of her staples out -Advised patient may now weightbear as tolerated with CAM boot  -Advised patient to limit activity to necessity  -Advised patient to ice and elevate  -Refilled Motrin  -Will plan for finishing staple removal at next visit. In the  meantime, patient to call office if any issues or problems arise.   Landis Martins, DPM

## 2019-12-26 ENCOUNTER — Other Ambulatory Visit: Payer: Self-pay | Admitting: Sports Medicine

## 2019-12-26 DIAGNOSIS — M7661 Achilles tendinitis, right leg: Secondary | ICD-10-CM

## 2020-01-08 ENCOUNTER — Ambulatory Visit (INDEPENDENT_AMBULATORY_CARE_PROVIDER_SITE_OTHER): Payer: 59 | Admitting: Sports Medicine

## 2020-01-08 ENCOUNTER — Encounter: Payer: Self-pay | Admitting: Sports Medicine

## 2020-01-08 ENCOUNTER — Other Ambulatory Visit: Payer: Self-pay

## 2020-01-08 DIAGNOSIS — Z9889 Other specified postprocedural states: Secondary | ICD-10-CM

## 2020-01-08 DIAGNOSIS — M79671 Pain in right foot: Secondary | ICD-10-CM

## 2020-01-08 DIAGNOSIS — M722 Plantar fascial fibromatosis: Secondary | ICD-10-CM

## 2020-01-08 DIAGNOSIS — M7731 Calcaneal spur, right foot: Secondary | ICD-10-CM

## 2020-01-08 DIAGNOSIS — M7661 Achilles tendinitis, right leg: Secondary | ICD-10-CM

## 2020-01-08 MED ORDER — HYDROMORPHONE HCL 4 MG PO TABS
4.0000 mg | ORAL_TABLET | ORAL | 0 refills | Status: DC | PRN
Start: 2020-01-08 — End: 2020-01-09

## 2020-01-08 MED ORDER — GABAPENTIN 600 MG PO TABS
600.0000 mg | ORAL_TABLET | Freq: Three times a day (TID) | ORAL | 3 refills | Status: DC
Start: 2020-01-08 — End: 2020-02-04

## 2020-01-08 NOTE — Progress Notes (Signed)
Subjective: Tracy Rosales is a 44 y.o. female patient seen today in office for POV #4 (DOS 11/24/19), S/P R removal of heel spur, achilles repair, and EPF. Patient admits pain at surgical site and states that she is wondering if she can go back to work, denies calf pain, denies headache, chest pain, shortness of breath, nausea, vomiting, fever, or chills but still reports pain at night and inablity to sleep.  No other issues noted.   Patient Active Problem List   Diagnosis Date Noted  . History of COVID-19 07/10/2019  . Cough 07/10/2019  . Pneumonia due to COVID-19 virus 06/28/2019  . Acute respiratory failure with hypoxia ([place]) 06/28/2019  . Morbid obesity ([place]) 06/28/2019  . Obesity (BMI 30-39.9) 05/30/2017  . Family history of diabetes mellitus in mother 05/30/2017  . History of PID 05/30/2017  . Endometriosis 05/30/2017  . Fibroids 05/30/2017  . Female dyspareunia 05/30/2017  . Mild intermittent asthma 05/30/2017  . BV (bacterial vaginosis) 05/30/2017  . Pelvic pain in female 10/13/2011    Current Outpatient Medications on File Prior to Visit  Medication Sig Dispense Refill  . albuterol (VENTOLIN HFA) 108 (90 Base) MCG/ACT inhaler Inhale 2 puffs into the lungs every 6 (six) hours as needed for wheezing or shortness of breath. 6.7 g 0  . diclofenac Sodium (VOLTAREN) 1 % GEL Apply 2 g topically 3 (three) times daily as needed (Apply on the left chest wall as needed for pain). . 2 g 0  . docusate sodium (COLACE) 100 MG capsule Take 1 capsule (100 mg total) by mouth 2 (two) times daily. 10 capsule 0  . ibuprofen (ADVIL) 800 MG tablet Take 1 tablet (800 mg total) by mouth every 8 (eight) hours as needed. 30 tablet 0  . ibuprofen (ADVIL) 800 MG tablet Take 1 tablet (800 mg total) by mouth every 8 (eight) hours as needed. 30 tablet 0  . ibuprofen (ADVIL) 800 MG tablet Take 1 tablet (800 mg total) by mouth every 8 (eight) hours as needed. 30 tablet 0  . metaxalone (SKELAXIN) 400 MG tablet  Take 1 tablet (400 mg total) by mouth 3 (three) times daily. 30 tablet 2  . montelukast (SINGULAIR) 10 MG tablet Take 1 tablet (10 mg total) by mouth at bedtime. 30 tablet 3  . promethazine (PHENERGAN) 12.5 MG tablet Take 1 tablet (12.5 mg total) by mouth every 8 (eight) hours as needed for nausea or vomiting. 20 tablet 0  . trimethoprim-polymyxin b (POLYTRIM) ophthalmic solution Place 2 drops into both eyes in the morning, at noon, and at bedtime. 10 mL 0   No current facility-administered medications on file prior to visit.    Allergies  Allergen Reactions  . Codeine Itching    Objective: There were no vitals filed for this visit.  General: No acute distress, AAOx3  Right foot: Staples intact with no gapping or dehiscence at surgical site, mild swelling to right foot, no erythema, no warmth, no drainage, no signs of infection noted, Capillary fill time <3 seconds in all digits, gross sensation present via light touch to right foot. Mild pain but no crepitation with range of motion right foot.  No pain with calf compression.   Assessment and Plan:  Problem List Items Addressed This Visit    None    Visit Diagnoses    Tendonitis, Achilles, right    -  Primary   Plantar fasciitis, right       Heel spur, right  S/P foot surgery, right       Right foot pain          -Patient seen and evaluated -Remaining staples removed -Applied betadine and dry sterile dressing to surgical site right foot secured with ACE wrap and stockinet  -Patient may remove tomorrow to shower and may redress as needed with betadine and ace wrap -May continue to weightbear in CAM boot and advised to limit activity to tolerance no ecessive walking or standing -Advised 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 In the meantime, patient to call office if any issues or problems arise.   Landis Martins, DPM

## 2020-01-09 ENCOUNTER — Telehealth: Payer: Self-pay | Admitting: Sports Medicine

## 2020-01-09 ENCOUNTER — Other Ambulatory Visit: Payer: Self-pay | Admitting: Sports Medicine

## 2020-01-09 MED ORDER — HYDROMORPHONE HCL 4 MG PO TABS: 4.0000 mg | ORAL_TABLET | Freq: Four times a day (QID) | ORAL | 0 refills | Status: AC | PRN

## 2020-01-09 NOTE — Progress Notes (Signed)
Dr. Sherryle Lis has already changed the Rx to every 6 hours

## 2020-01-09 NOTE — Addendum Note (Signed)
Addended bySherryle Lis, Lenell Lama R on: 01/09/2020 05:38 PM   Modules accepted: Orders

## 2020-01-09 NOTE — Telephone Encounter (Signed)
[facility] said the insurance will not pay for the medication to be administered every 4 hours. It did cover it last time every 6 hours. It needs to be revised.

## 2020-01-09 NOTE — Telephone Encounter (Signed)
Thanks Dr. Sherryle Lis already did it for me

## 2020-01-09 NOTE — Telephone Encounter (Signed)
Re-sent for q6h #28 tabs  Lanae Crumbly, DPM 01/09/2020

## 2020-01-22 ENCOUNTER — Ambulatory Visit (INDEPENDENT_AMBULATORY_CARE_PROVIDER_SITE_OTHER): Payer: 59 | Admitting: Sports Medicine

## 2020-01-22 ENCOUNTER — Other Ambulatory Visit: Payer: Self-pay

## 2020-01-22 ENCOUNTER — Encounter: Payer: Self-pay | Admitting: Sports Medicine

## 2020-01-22 DIAGNOSIS — M722 Plantar fascial fibromatosis: Secondary | ICD-10-CM

## 2020-01-22 DIAGNOSIS — M79671 Pain in right foot: Secondary | ICD-10-CM

## 2020-01-22 DIAGNOSIS — Z9889 Other specified postprocedural states: Secondary | ICD-10-CM

## 2020-01-22 DIAGNOSIS — M7731 Calcaneal spur, right foot: Secondary | ICD-10-CM

## 2020-01-22 DIAGNOSIS — M7661 Achilles tendinitis, right leg: Secondary | ICD-10-CM

## 2020-01-22 MED ORDER — DICLOFENAC SODIUM 1 % EX GEL: 4.0000 g | Freq: Four times a day (QID) | CUTANEOUS | 1 refills | Status: AC

## 2020-01-22 MED ORDER — FUROSEMIDE 20 MG PO TABS: 20.0000 mg | ORAL_TABLET | Freq: Every day | ORAL | 3 refills | Status: DC

## 2020-01-22 NOTE — Progress Notes (Signed)
Subjective: Tracy Rosales is a 44 y.o. female patient seen today in office for POV #5 (DOS 11/24/19), S/P R removal of heel spur, achilles repair, and EPF. 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  No other issues noted.   Patient Active Problem List   Diagnosis Date Noted  . History of COVID-19 07/10/2019  . Cough 07/10/2019  . Pneumonia due to COVID-19 virus 06/28/2019  . Acute respiratory failure with hypoxia ([place]) 06/28/2019  . Morbid obesity ([place]) 06/28/2019  . Obesity (BMI 30-39.9) 05/30/2017  . Family history of diabetes mellitus in mother 05/30/2017  . History of PID 05/30/2017  . Endometriosis 05/30/2017  . Fibroids 05/30/2017  . Female dyspareunia 05/30/2017  . Mild intermittent asthma 05/30/2017  . BV (bacterial vaginosis) 05/30/2017  . Pelvic pain in female 10/13/2011    Current Outpatient Medications on File Prior to Visit  Medication Sig Dispense Refill  . albuterol (VENTOLIN HFA) 108 (90 Base) MCG/ACT inhaler Inhale 2 puffs into the lungs every 6 (six) hours as needed for wheezing or shortness of breath. 6.7 g 0  . docusate sodium (COLACE) 100 MG capsule Take 1 capsule (100 mg total) by mouth 2 (two) times daily. 10 capsule 0  . gabapentin (NEURONTIN) 600 MG tablet Take 1 tablet (600 mg total) by mouth 3 (three) times daily. 90 tablet 3  . ibuprofen (ADVIL) 800 MG tablet Take 1 tablet (800 mg total) by mouth every 8 (eight) hours as needed. 30 tablet 0  . ibuprofen (ADVIL) 800 MG tablet Take 1 tablet (800 mg total) by mouth every 8 (eight) hours as needed. 30 tablet 0  . ibuprofen (ADVIL) 800 MG tablet Take 1 tablet (800 mg total) by mouth every 8 (eight) hours as needed. 30 tablet 0  . metaxalone (SKELAXIN) 400 MG tablet Take 1 tablet (400 mg total) by mouth 3 (three) times daily. 30 tablet 2  . montelukast (SINGULAIR) 10 MG tablet  Take 1 tablet (10 mg total) by mouth at bedtime. 30 tablet 3  . promethazine (PHENERGAN) 12.5 MG tablet Take 1 tablet (12.5 mg total) by mouth every 8 (eight) hours as needed for nausea or vomiting. 20 tablet 0  . trimethoprim-polymyxin b (POLYTRIM) ophthalmic solution Place 2 drops into both eyes in the morning, at noon, and at bedtime. 10 mL 0   No current facility-administered medications on file prior to visit.    Allergies  Allergen Reactions  . Codeine Itching    Objective: There were no vitals filed for this visit.  General: No acute distress, AAOx3  Right foot: Incisions well healed, mild swelling to right foot, no erythema, no warmth, no drainage, no signs of infection noted, Capillary fill time <3 seconds in all digits, gross sensation present via light touch to right foot. Mild pain but no crepitation with range of motion right foot.  No pain with calf compression.   Assessment and Plan:  Problem List Items Addressed This Visit    None    Visit Diagnoses    S/P foot surgery, right    -  Primary   Tendonitis, Achilles, right       Plantar fasciitis, right       Heel spur, right       Right foot pain          -Patient  seen and evaluated -Advised patient to rest ice elevate as instructed -Advised patient to use ankle supportive brace or Ace wrap for additional support and offloading to the surgical site -Prescribed Lasix for edema control -Advised patient to get good supportive shoes gave coupon for PCP or [employer] -Advised patient to limit activity to necessity but 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 In the meantime, patient to call office if any issues or problems arise.   Landis Martins, DPM

## 2020-02-03 ENCOUNTER — Encounter: Payer: Self-pay | Admitting: Sports Medicine

## 2020-02-04 ENCOUNTER — Other Ambulatory Visit: Payer: Self-pay | Admitting: Sports Medicine

## 2020-02-04 DIAGNOSIS — Z9889 Other specified postprocedural states: Secondary | ICD-10-CM

## 2020-02-04 MED ORDER — HYDROMORPHONE HCL 4 MG PO TABS: 4.0000 mg | ORAL_TABLET | Freq: Four times a day (QID) | ORAL | 0 refills | Status: AC | PRN

## 2020-02-04 MED ORDER — GABAPENTIN 600 MG PO TABS: 600.0000 mg | ORAL_TABLET | Freq: Four times a day (QID) | ORAL | 3 refills | Status: AC | PRN

## 2020-02-04 MED ORDER — DICLOFENAC SODIUM 75 MG PO TBEC: 75.0000 mg | DELAYED_RELEASE_TABLET | Freq: Two times a day (BID) | ORAL | 0 refills | Status: DC

## 2020-02-04 NOTE — Progress Notes (Signed)
Sent medication for pain since patient had increased episodes when she returned back to work

## 2020-02-05 ENCOUNTER — Telehealth: Payer: Self-pay | Admitting: Sports Medicine

## 2020-02-05 NOTE — Telephone Encounter (Signed)
Lvm for patient to get her scheduled in 2 weeks to see Dr. Cannon Kettle for postoperative foot pain

## 2020-02-19 ENCOUNTER — Encounter: Payer: 59 | Admitting: Sports Medicine

## 2020-03-11 ENCOUNTER — Encounter: Payer: 59 | Admitting: Sports Medicine

## 2020-03-15 ENCOUNTER — Other Ambulatory Visit: Payer: Self-pay

## 2020-03-25 ENCOUNTER — Other Ambulatory Visit: Payer: Self-pay

## 2020-03-25 ENCOUNTER — Encounter: Payer: Self-pay | Admitting: Sports Medicine

## 2020-03-25 ENCOUNTER — Ambulatory Visit (INDEPENDENT_AMBULATORY_CARE_PROVIDER_SITE_OTHER): Payer: 59

## 2020-03-25 ENCOUNTER — Ambulatory Visit (INDEPENDENT_AMBULATORY_CARE_PROVIDER_SITE_OTHER): Payer: 59 | Admitting: Sports Medicine

## 2020-03-25 DIAGNOSIS — M7661 Achilles tendinitis, right leg: Secondary | ICD-10-CM

## 2020-03-25 DIAGNOSIS — M79671 Pain in right foot: Secondary | ICD-10-CM

## 2020-03-25 DIAGNOSIS — M7731 Calcaneal spur, right foot: Secondary | ICD-10-CM | POA: Diagnosis not present

## 2020-03-25 DIAGNOSIS — Z9889 Other specified postprocedural states: Secondary | ICD-10-CM | POA: Diagnosis not present

## 2020-03-25 DIAGNOSIS — M722 Plantar fascial fibromatosis: Secondary | ICD-10-CM | POA: Diagnosis not present

## 2020-03-25 MED ORDER — HYDROMORPHONE HCL 4 MG PO TABS
4.0000 mg | ORAL_TABLET | Freq: Four times a day (QID) | ORAL | 0 refills | Status: AC | PRN
Start: 2020-03-25 — End: 2020-03-30

## 2020-03-25 MED ORDER — PREGABALIN 75 MG PO CAPS: 75.0000 mg | ORAL_CAPSULE | Freq: Two times a day (BID) | ORAL | 2 refills | Status: DC

## 2020-03-25 MED ORDER — PREDNISONE 20 MG PO TABS: 20.0000 mg | ORAL_TABLET | Freq: Every day | ORAL | 0 refills | Status: DC

## 2020-03-25 MED ORDER — BENZOCAINE (TOPICAL) 20 % EX OINT: TOPICAL_OINTMENT | CUTANEOUS | 0 refills | Status: DC

## 2020-03-25 MED ORDER — HYDROMORPHONE HCL 4 MG PO TABS: 4.0000 mg | ORAL_TABLET | Freq: Four times a day (QID) | ORAL | 0 refills | Status: DC | PRN

## 2020-03-25 MED ORDER — BENZOCAINE (TOPICAL) 20 % EX OINT: TOPICAL_OINTMENT | CUTANEOUS | 0 refills | Status: AC

## 2020-03-25 MED ORDER — PREDNISONE 20 MG PO TABS
20.0000 mg | ORAL_TABLET | Freq: Every day | ORAL | 0 refills | Status: AC
Start: 2020-03-25 — End: ?

## 2020-03-25 NOTE — Progress Notes (Signed)
Subjective: Tracy Rosales is a 45 y.o. female patient seen today in office for POV #6 (DOS 11/24/19), S/P R removal of heel spur, achilles repair, and EPF. Patient admits swelling and pain worse with work and nothing has helped. Got new shoes.  No other issues noted.   Patient Active Problem List   Diagnosis Date Noted  . History of COVID-19 07/10/2019  . Cough 07/10/2019  . Pneumonia due to COVID-19 virus 06/28/2019  . Acute respiratory failure with hypoxia ([place]) 06/28/2019  . Morbid obesity ([place]) 06/28/2019  . Obesity (BMI 30-39.9) 05/30/2017  . Family history of diabetes mellitus in mother 05/30/2017  . History of PID 05/30/2017  . Endometriosis 05/30/2017  . Fibroids 05/30/2017  . Female dyspareunia 05/30/2017  . Mild intermittent asthma 05/30/2017  . BV (bacterial vaginosis) 05/30/2017  . Pelvic pain in female 10/13/2011    Current Outpatient Medications on File Prior to Visit  Medication Sig Dispense Refill  . albuterol (VENTOLIN HFA) 108 (90 Base) MCG/ACT inhaler Inhale 2 puffs into the lungs every 6 (six) hours as needed for wheezing or shortness of breath. 6.7 g 0  . diclofenac (VOLTAREN) 75 MG EC tablet Take 1 tablet (75 mg total) by mouth 2 (two) times daily. 30 tablet 0  . diclofenac Sodium (VOLTAREN) 1 % GEL Apply 4 g topically 4 (four) times daily. As needed for foot pain and swelling 150 g 1  . docusate sodium (COLACE) 100 MG capsule Take 1 capsule (100 mg total) by mouth 2 (two) times daily. 10 capsule 0  . furosemide (LASIX) 20 MG tablet Take 1 tablet (20 mg total) by mouth daily. 30 tablet 3  . gabapentin (NEURONTIN) 600 MG tablet Take 1 tablet (600 mg total) by mouth 4 (four) times daily as needed. 90 tablet 3  . ibuprofen (ADVIL) 800 MG tablet Take 1 tablet (800 mg total) by mouth every 8 (eight) hours as needed. 30 tablet 0  . ibuprofen (ADVIL) 800 MG tablet Take 1 tablet (800 mg total) by mouth every 8 (eight) hours as needed. 30 tablet 0  . ibuprofen (ADVIL)  800 MG tablet Take 1 tablet (800 mg total) by mouth every 8 (eight) hours as needed. 30 tablet 0  . metaxalone (SKELAXIN) 400 MG tablet Take 1 tablet (400 mg total) by mouth 3 (three) times daily. 30 tablet 2  . montelukast (SINGULAIR) 10 MG tablet Take 1 tablet (10 mg total) by mouth at bedtime. 30 tablet 3  . promethazine (PHENERGAN) 12.5 MG tablet Take 1 tablet (12.5 mg total) by mouth every 8 (eight) hours as needed for nausea or vomiting. 20 tablet 0  . trimethoprim-polymyxin b (POLYTRIM) ophthalmic solution Place 2 drops into both eyes in the morning, at noon, and at bedtime. 10 mL 0   No current facility-administered medications on file prior to visit.    Allergies  Allergen Reactions  . Codeine Itching    Objective: There were no vitals filed for this visit.  General: No acute distress, AAOx3  Right foot: Incisions well healed, mild swelling to right foot, no erythema, no warmth, no drainage, no signs of infection noted, Capillary fill time <3 seconds in all digits, gross sensation present via light touch to right foot. Diffused pain to plantar>posterior right heel.  No pain with calf compression.   Assessment and Plan:  Problem List Items Addressed This Visit   None   Visit Diagnoses    Heel spur, right    -  Primary  Relevant Orders   DG Foot Complete Right (Completed)   Tendonitis, Achilles, right       Plantar fasciitis, right       S/P foot surgery, right       Right foot pain          -Patient seen and evaluated -Xrays consistent with post op status -Rx Benzocaine 20% for topical use for pain _Rx Prednisone for swelling and pain at right foot and ankle -Rx Lyrica for burning pain -Rx Dilaudid for any pain at end of work shift -Dispensed surgigrip sleeve and advised patient if works well may benefit from copper fit sleeve -Recommend good supportive shoes and OTC insoles/good feet store since she liked those in the past -Will plan for post op check at next  visit. In the meantime, patient to call office if any issues or problems arise.   Landis Martins, DPM

## 2020-04-14 ENCOUNTER — Encounter: Payer: Self-pay | Admitting: Sports Medicine

## 2020-04-14 ENCOUNTER — Other Ambulatory Visit: Payer: Self-pay | Admitting: Sports Medicine

## 2020-04-14 MED ORDER — PREDNISONE 20 MG PO TABS: 20.0000 mg | ORAL_TABLET | Freq: Every day | ORAL | 1 refills | Status: AC

## 2020-04-14 NOTE — Progress Notes (Signed)
Refilled predinsone

## 2020-04-22 ENCOUNTER — Encounter: Payer: 59 | Admitting: Sports Medicine

## 2020-05-20 ENCOUNTER — Encounter: Payer: Self-pay | Admitting: Sports Medicine

## 2020-05-21 ENCOUNTER — Other Ambulatory Visit: Payer: Self-pay | Admitting: Sports Medicine

## 2020-05-21 DIAGNOSIS — Z9889 Other specified postprocedural states: Secondary | ICD-10-CM

## 2020-05-21 MED ORDER — FUROSEMIDE 20 MG PO TABS: 20.0000 mg | ORAL_TABLET | Freq: Every day | ORAL | 3 refills | Status: AC

## 2020-05-21 MED ORDER — PREGABALIN 75 MG PO CAPS
75.0000 mg | ORAL_CAPSULE | Freq: Two times a day (BID) | ORAL | 2 refills | Status: AC
Start: 2020-05-21 — End: ?

## 2020-05-21 MED ORDER — DICLOFENAC SODIUM 75 MG PO TBEC: 75.0000 mg | DELAYED_RELEASE_TABLET | Freq: Two times a day (BID) | ORAL | 0 refills | Status: AC

## 2020-05-21 NOTE — Progress Notes (Signed)
Medication refill provided.

## 2020-05-31 ENCOUNTER — Encounter (HOSPITAL_COMMUNITY): Payer: Self-pay

## 2020-05-31 ENCOUNTER — Other Ambulatory Visit: Payer: Self-pay

## 2020-05-31 ENCOUNTER — Emergency Department (HOSPITAL_COMMUNITY)
Admission: EM | Admit: 2020-05-31 | Discharge: 2020-05-31 | Disposition: A | Discharge status: Home / Self Care | Payer: 59 | Attending: Emergency Medicine | Admitting: Emergency Medicine

## 2020-05-31 DIAGNOSIS — M79671 Pain in right foot: Secondary | ICD-10-CM | POA: Insufficient documentation

## 2020-05-31 DIAGNOSIS — M79609 Pain in unspecified limb: Secondary | ICD-10-CM

## 2020-05-31 DIAGNOSIS — Z8616 Personal history of COVID-19: Secondary | ICD-10-CM | POA: Insufficient documentation

## 2020-05-31 DIAGNOSIS — J45909 Unspecified asthma, uncomplicated: Secondary | ICD-10-CM | POA: Diagnosis not present

## 2020-05-31 DIAGNOSIS — R197 Diarrhea, unspecified: Secondary | ICD-10-CM | POA: Diagnosis not present

## 2020-05-31 LAB — CBC
HCT: 36 % (ref 36.0–46.0)
Hemoglobin: 11.6 g/dL — ABNORMAL LOW (ref 12.0–15.0)
MCH: 29.2 pg (ref 26.0–34.0)
MCHC: 32.2 g/dL (ref 30.0–36.0)
MCV: 90.7 fL (ref 80.0–100.0)
Platelets: 355 10*3/uL (ref 150–400)
RBC: 3.97 MIL/uL (ref 3.87–5.11)
RDW: 13.6 % (ref 11.5–15.5)
WBC: 5.9 10*3/uL (ref 4.0–10.5)
nRBC: 0 % (ref 0.0–0.2)

## 2020-05-31 LAB — LIPASE, BLOOD: Lipase: 40 U/L (ref 11–51)

## 2020-05-31 LAB — COMPREHENSIVE METABOLIC PANEL
ALT: 46 U/L — ABNORMAL HIGH (ref 0–44)
AST: 40 U/L (ref 15–41)
Albumin: 4.2 g/dL (ref 3.5–5.0)
Alkaline Phosphatase: 47 U/L (ref 38–126)
Anion gap: 7 (ref 5–15)
BUN: 12 mg/dL (ref 6–20)
CO2: 22 mmol/L (ref 22–32)
Calcium: 8.5 mg/dL — ABNORMAL LOW (ref 8.9–10.3)
Chloride: 108 mmol/L (ref 98–111)
Creatinine, Ser: 0.72 mg/dL (ref 0.44–1.00)
GFR, Estimated: >60 mL/min (ref >60)
Glucose, Bld: 119 mg/dL — ABNORMAL HIGH (ref 70–99)
Potassium: 3.5 mmol/L (ref 3.5–5.1)
Sodium: 137 mmol/L (ref 135–145)
Total Bilirubin: 0.5 mg/dL (ref 0.3–1.2)
Total Protein: 7.3 g/dL (ref 6.5–8.1)

## 2020-05-31 LAB — I-STAT BETA HCG BLOOD, ED (MC, WL, AP ONLY): I-stat hCG, quantitative: <5 m[IU]/mL (ref <5)

## 2020-05-31 MED ORDER — LOPERAMIDE HCL 2 MG PO CAPS
2.0000 mg | ORAL_CAPSULE | ORAL | Status: DC | PRN
  Administered 2020-05-31: 2 mg via ORAL
  Filled 2020-05-31: qty 1

## 2020-05-31 MED ORDER — METHOCARBAMOL 750 MG PO TABS: 750.0000 mg | ORAL_TABLET | Freq: Four times a day (QID) | ORAL | 0 refills | Status: DC

## 2020-05-31 MED ORDER — METHOCARBAMOL 750 MG PO TABS: 750.0000 mg | ORAL_TABLET | Freq: Four times a day (QID) | ORAL | 0 refills | Status: AC

## 2020-05-31 MED ORDER — IBUPROFEN 800 MG PO TABS
800.0000 mg | ORAL_TABLET | Freq: Once | ORAL | Status: AC
  Administered 2020-05-31: 800 mg via ORAL
  Filled 2020-05-31: qty 1

## 2020-05-31 NOTE — ED Provider Notes (Signed)
[facility] DEPT Provider Note   CSN: [id number] Arrival date & time: 05/31/20  1119     History Chief Complaint  Patient presents with  . Diarrhea  . Foot Pain    Tracy Rosales is a 45 y.o. female.  45 year old female presents with persistent pain to her right foot and Achilles tendon.  Had surgery on this recently has had issues with this after prolonged periods of standing.  Denies any new injuries.  Pain better with rest.  Also has had slight watery diarrhea without associated fever abdominal cramping.  Denies any recent antibiotic use.  No prior history of same        Past Medical History:  Diagnosis Date  . Abnormal uterine bleeding   . Asthma   . Bronchitis   . Depression   . Endometriosis   . Headache(784.0)   . Obesity   . Uterine fibroid   . Vaginal Pap smear, abnormal    2017    Patient Active Problem List   Diagnosis Date Noted  . History of COVID-19 07/10/2019  . Cough 07/10/2019  . Pneumonia due to COVID-19 virus 06/28/2019  . Acute respiratory failure with hypoxia ([place]) 06/28/2019  . Morbid obesity ([place]) 06/28/2019  . Obesity (BMI 30-39.9) 05/30/2017  . Family history of diabetes mellitus in mother 05/30/2017  . History of PID 05/30/2017  . Endometriosis 05/30/2017  . Fibroids 05/30/2017  . Female dyspareunia 05/30/2017  . Mild intermittent asthma 05/30/2017  . BV (bacterial vaginosis) 05/30/2017  . Pelvic pain in female 10/13/2011    Past Surgical History:  Procedure Laterality Date  . CESAREAN SECTION    . DILATION AND CURETTAGE OF UTERUS     for heavy periods   . FOOT SURGERY Right      OB History    Gravida  2   Para  2   Term  2   Preterm      AB      Living  2     SAB      IAB      Ectopic      Multiple      Live Births              Family History  Problem Relation Age of Onset  . Diabetes Mother   . Hypertension Mother   . Thyroid disease Maternal Aunt   . Uterine  cancer Cousin   . Migraines Son     Social History   Tobacco Use  . Smoking status: Never Smoker  . Smokeless tobacco: Never Used  Vaping Use  . Vaping Use: Never used  Substance Use Topics  . Alcohol use: Yes    Alcohol/week: 2.0 standard drinks    Types: 1 Glasses of wine, 1 Cans of beer per week    Comment: pt reports drinking 3-4 drinks per week  . Drug use: No    Home Medications Prior to Admission medications   Medication Sig Start Date End Date Taking? Authorizing Provider  albuterol (VENTOLIN HFA) 108 (90 Base) MCG/ACT inhaler Inhale 2 puffs into the lungs every 6 (six) hours as needed for wheezing or shortness of breath. 07/02/19   Thurnell Lose, MD  Benzocaine 20 % OINT Use topically as needed for pain 03/25/20   Landis Martins, DPM  diclofenac (VOLTAREN) 75 MG EC tablet Take 1 tablet (75 mg total) by mouth 2 (two) times daily. 05/21/20   Landis Martins, DPM  diclofenac Sodium (VOLTAREN)  1 % GEL Apply 4 g topically 4 (four) times daily. As needed for foot pain and swelling 01/22/20   Landis Martins, DPM  docusate sodium (COLACE) 100 MG capsule Take 1 capsule (100 mg total) by mouth 2 (two) times daily. 11/24/19   Landis Martins, DPM  furosemide (LASIX) 20 MG tablet Take 1 tablet (20 mg total) by mouth daily. 05/21/20   Landis Martins, DPM  gabapentin (NEURONTIN) 600 MG tablet Take 1 tablet (600 mg total) by mouth 4 (four) times daily as needed. 02/04/20   Landis Martins, DPM  ibuprofen (ADVIL) 800 MG tablet Take 1 tablet (800 mg total) by mouth every 8 (eight) hours as needed. 11/24/19   Landis Martins, DPM  ibuprofen (ADVIL) 800 MG tablet Take 1 tablet (800 mg total) by mouth every 8 (eight) hours as needed. 12/04/19   Landis Martins, DPM  ibuprofen (ADVIL) 800 MG tablet Take 1 tablet (800 mg total) by mouth every 8 (eight) hours as needed. 12/25/19   Landis Martins, DPM  metaxalone (SKELAXIN) 400 MG tablet Take 1 tablet (400 mg total) by mouth 3 (three) times daily.  12/11/19   Stover, Titorya, DPM  montelukast (SINGULAIR) 10 MG tablet Take 1 tablet (10 mg total) by mouth at bedtime. 07/09/19   Fenton Foy, NP  predniSONE (DELTASONE) 20 MG tablet Take 1 tablet (20 mg total) by mouth daily with breakfast. 03/25/20   Landis Martins, DPM  predniSONE (DELTASONE) 20 MG tablet Take 1 tablet (20 mg total) by mouth daily with breakfast. 04/14/20   Landis Martins, DPM  pregabalin (LYRICA) 75 MG capsule Take 1 capsule (75 mg total) by mouth 2 (two) times daily. 05/21/20   Landis Martins, DPM  promethazine (PHENERGAN) 12.5 MG tablet Take 1 tablet (12.5 mg total) by mouth every 8 (eight) hours as needed for nausea or vomiting. 11/24/19   Landis Martins, DPM  trimethoprim-polymyxin b (POLYTRIM) ophthalmic solution Place 2 drops into both eyes in the morning, at noon, and at bedtime. 06/20/19   Scot Jun, FNP    Allergies    Codeine  Review of Systems   Review of Systems  All other systems reviewed and are negative.   Physical Exam Updated Vital Signs BP (!) 141/89 (BP Location: Right Arm)   Pulse 85   Temp 98.3 F (36.8 C) (Oral)   Resp 18   Ht 1.626 m (5\' 4" )   Wt 107.5 kg   LMP 05/25/2020 (Approximate)   SpO2 98%   BMI 40.68 kg/m   Physical Exam Vitals and nursing note reviewed.  Constitutional:      General: She is not in acute distress.    Appearance: Normal appearance. She is well-developed. She is not toxic-appearing.  HENT:     Head: Normocephalic and atraumatic.  Eyes:     General: Lids are normal.     Conjunctiva/sclera: Conjunctivae normal.     Pupils: Pupils are equal, round, and reactive to light.  Neck:     Thyroid: No thyroid mass.     Trachea: No tracheal deviation.  Cardiovascular:     Rate and Rhythm: Normal rate and regular rhythm.     Heart sounds: Normal heart sounds. No murmur heard. No gallop.   Pulmonary:     Effort: Pulmonary effort is normal. No respiratory distress.     Breath sounds: Normal breath sounds.  No stridor. No decreased breath sounds, wheezing, rhonchi or rales.  Abdominal:     General: Bowel sounds are normal. There is  no distension.     Palpations: Abdomen is soft.     Tenderness: There is no abdominal tenderness. There is no rebound.  Musculoskeletal:        General: No tenderness. Normal range of motion.     Cervical back: Normal range of motion and neck supple.       Legs:     Comments: Some with dorsiflexion.  Neurovascular intact at the right foot  Skin:    General: Skin is warm and dry.     Findings: No abrasion or rash.  Neurological:     Mental Status: She is alert and oriented to person, place, and time.     GCS: GCS eye subscore is 4. GCS verbal subscore is 5. GCS motor subscore is 6.     Cranial Nerves: No cranial nerve deficit.     Sensory: No sensory deficit.  Psychiatric:        Speech: Speech normal.        Behavior: Behavior normal.     ED Results / Procedures / Treatments   Labs (all labs ordered are listed, but only abnormal results are displayed) Labs Reviewed  COMPREHENSIVE METABOLIC PANEL - Abnormal; Notable for the following components:      Result Value   Glucose, Bld 119 (*)    Calcium 8.5 (*)    ALT 46 (*)    All other components within normal limits  CBC - Abnormal; Notable for the following components:   Hemoglobin 11.6 (*)    All other components within normal limits  LIPASE, BLOOD  URINALYSIS, ROUTINE W REFLEX MICROSCOPIC  I-STAT BETA HCG BLOOD, ED ([facility], [facility], [facility] ONLY)    EKG None  Radiology No results found.  Procedures Procedures   Medications Ordered in ED Medications  ibuprofen (ADVIL) tablet 800 mg (has no administration in time range)    ED Course  I have reviewed the triage vital signs and the nursing notes.  Pertinent labs & imaging results that were available during my care of the patient were reviewed by me and considered in my medical decision making (see chart for details).    MDM Rules/Calculators/A&P                           Patient given Motrin here and will place on Robaxin.  We will also give Lomotil here.  Labs are without evidence of significant abnormality Final Clinical Impression(s) / ED Diagnoses Final diagnoses:  None    Rx / DC Orders ED Discharge Orders    None       Lacretia Leigh, MD 05/31/20 1313

## 2020-05-31 NOTE — ED Triage Notes (Signed)
Patient c/o diarrhea x 1 week and a pain in the right foot x 1 week.

## 2020-05-31 NOTE — Discharge Instructions (Signed)
Follow-up with your podiatrist

## 2020-06-02 ENCOUNTER — Other Ambulatory Visit: Payer: Self-pay | Admitting: Sports Medicine

## 2020-06-02 MED ORDER — IBUPROFEN 800 MG PO TABS: 800.0000 mg | ORAL_TABLET | Freq: Three times a day (TID) | ORAL | 0 refills | Status: AC | PRN

## 2020-06-18 ENCOUNTER — Encounter: Payer: Self-pay | Admitting: Sports Medicine

## 2020-06-18 ENCOUNTER — Other Ambulatory Visit: Payer: Self-pay | Admitting: Sports Medicine

## 2020-06-18 DIAGNOSIS — M792 Neuralgia and neuritis, unspecified: Secondary | ICD-10-CM

## 2020-06-19 ENCOUNTER — Other Ambulatory Visit: Payer: Self-pay | Admitting: Sports Medicine

## 2020-06-19 MED ORDER — TRAMADOL HCL 50 MG PO TABS: 50.0000 mg | ORAL_TABLET | Freq: Three times a day (TID) | ORAL | 0 refills | Status: DC | PRN

## 2020-06-19 NOTE — Progress Notes (Signed)
Sent tramadol for pain -Dr. Cannon Kettle

## 2020-06-21 ENCOUNTER — Encounter: Payer: Self-pay | Admitting: Neurology

## 2020-06-21 ENCOUNTER — Other Ambulatory Visit: Payer: Self-pay | Admitting: Sports Medicine

## 2020-06-21 MED ORDER — TRAMADOL HCL 50 MG PO TABS: 50.0000 mg | ORAL_TABLET | Freq: Three times a day (TID) | ORAL | 0 refills | Status: AC | PRN

## 2020-06-21 NOTE — Progress Notes (Signed)
Changed Rx for Tramadol from [facility] to [facility] -Dr. Chauncey Cruel

## 2020-07-01 ENCOUNTER — Other Ambulatory Visit: Payer: Self-pay | Admitting: Sports Medicine

## 2020-07-01 MED ORDER — OXYCODONE-ACETAMINOPHEN 10-325 MG PO TABS: 1.0000 | ORAL_TABLET | Freq: Three times a day (TID) | ORAL | 0 refills | Status: AC | PRN

## 2020-07-01 NOTE — Progress Notes (Signed)
Sent Rx for pain meds this time only for flare up and advised patient that since she is having pain all the way up into hip she should go to Urgent care for likely sciatica if her symptoms do not improve -Dr. Chauncey Cruel

## 2020-07-12 ENCOUNTER — Encounter: Payer: Self-pay | Admitting: Sports Medicine

## 2020-07-21 ENCOUNTER — Encounter: Payer: Self-pay | Admitting: Sports Medicine

## 2020-09-10 ENCOUNTER — Ambulatory Visit: Payer: 59 | Admitting: Neurology

## 2020-09-15 DIAGNOSIS — M79676 Pain in unspecified toe(s): Secondary | ICD-10-CM

## 2020-09-29 ENCOUNTER — Telehealth: Payer: Self-pay | Admitting: *Deleted

## 2020-09-29 NOTE — Telephone Encounter (Signed)
Patient is requesting that the [employer] medical report form 500 completed be mail to her new address in [place]. Mailed to patient's new address on 09/09/20.

## 2020-09-30 ENCOUNTER — Telehealth: Payer: Self-pay | Admitting: *Deleted

## 2020-10-05 ENCOUNTER — Telehealth: Payer: Self-pay | Admitting: Sports Medicine

## 2020-10-05 NOTE — Telephone Encounter (Signed)
Good Morning  Tracy Rosales called this morning complaining about how her paperwork was completed. I told her I didn't complete paperwork, so I dont have a copy and I dont know what is on the paperwork. But she said that by putting that she was not totally disabled then that disqualified her from disability. She would like for you to give her a call.

## 2020-10-08 ENCOUNTER — Telehealth (INDEPENDENT_AMBULATORY_CARE_PROVIDER_SITE_OTHER): Payer: Self-pay | Admitting: Sports Medicine

## 2020-10-08 DIAGNOSIS — M7731 Calcaneal spur, right foot: Secondary | ICD-10-CM

## 2020-10-08 DIAGNOSIS — Z9889 Other specified postprocedural states: Secondary | ICD-10-CM

## 2020-10-08 DIAGNOSIS — G8929 Other chronic pain: Secondary | ICD-10-CM

## 2020-10-08 DIAGNOSIS — M7661 Achilles tendinitis, right leg: Secondary | ICD-10-CM

## 2020-10-08 DIAGNOSIS — M792 Neuralgia and neuritis, unspecified: Secondary | ICD-10-CM

## 2020-10-08 DIAGNOSIS — M5431 Sciatica, right side: Secondary | ICD-10-CM

## 2020-10-08 DIAGNOSIS — M722 Plantar fascial fibromatosis: Secondary | ICD-10-CM

## 2020-10-08 DIAGNOSIS — M79671 Pain in right foot: Secondary | ICD-10-CM

## 2020-10-08 NOTE — Progress Notes (Signed)
Virtual Visit via Telephone Note  I connected with Tracy Rosales on 10/08/20 at  5:00 PM EDT by telephone and verified that I am speaking with the correct person using two identifiers.  Location: Patient: Tracy Rosales, home [place] Provider: Landis Martins, DPM, [facility] [place] [facility]   I discussed the limitations, risks, security and privacy concerns of performing an evaluation and management service by telephone and the availability of in person appointments. I also discussed with the patient that there may be a patient responsible charge related to this service. The patient expressed understanding and agreed to proceed.   History of Present Illness: 45 year old female patient met over telephone to discuss disability paperwork that was previously completed for her a few weeks ago.  Patient reports that because I said she could do light duty or sedentary work this disqualifies her for total disability.  Patient has several questions about the process for disability.   Observations/Objective: Physical exam unable to be performed due to telephone nature of visit  Assessment and Plan: Problem List Items Addressed This Visit   None Visit Diagnoses     Neuritis    -  Primary   S/P foot surgery, right       Heel spur, right       Tendonitis, Achilles, right       Plantar fasciitis, right       Right foot pain       Chronic foot pain, right       Chronic heel pain, right       Sciatica of right side          Discussed with patient in great detail that her last in office visit was January 2022.  I advised patient that based on her last exam I could not write that she was totally disabled due to the simple fact that patient can still ambulate and can still possibly do sedentary work including desk duty.  I advised patient that is very difficult to get total/complete disability for foot pain however I did advise patient that if she has other medical conditions  that are progressively worsening and adding to her ability to even sit to do sedentary work then this might help to qualify her for disability.  I advised patient to follow-up with the social daily administration and their protocols for disability which may require a more extensive medical exam.  Patient informed me that she found a foot doctor there in [place].  I advised patient to follow-up with her foot doctor there and if there are any additional records or information that that doctor needs she may contact my office to request.  Patient thanked me for calling and told me that she plans to come back to [place] next year.  I reassured patient if she continues to have problems when she relocates she is more than welcome to follow back up with me in the future.  Follow Up Instructions: As above   I discussed the assessment and treatment plan with the patient. The patient was provided an opportunity to ask questions and all were answered. The patient agreed with the plan and demonstrated an understanding of the instructions.   The patient was advised to call back or seek an in-person evaluation if the symptoms worsen or if the condition fails to improve as anticipated.  I provided 11 minutes of non-face-to-face time during this encounter.   Landis Martins, DPM

## 2020-11-01 NOTE — Telephone Encounter (Signed)
error
